# Patient Record
Sex: Female | Born: 1990 | Hispanic: No | Marital: Married | Smoking: Never smoker
Health system: Southern US, Community
[De-identification: ages and names within clinical notes are randomized; demographics above are authoritative.]

## PROBLEM LIST (undated history)

## (undated) ENCOUNTER — Inpatient Hospital Stay (HOSPITAL_COMMUNITY): Payer: Self-pay

## (undated) DIAGNOSIS — O139 Gestational [pregnancy-induced] hypertension without significant proteinuria, unspecified trimester: Secondary | ICD-10-CM

## (undated) DIAGNOSIS — D649 Anemia, unspecified: Secondary | ICD-10-CM

## (undated) DIAGNOSIS — Z789 Other specified health status: Secondary | ICD-10-CM

## (undated) HISTORY — PX: NO PAST SURGERIES: SHX2092

## (undated) HISTORY — DX: Gestational (pregnancy-induced) hypertension without significant proteinuria, unspecified trimester: O13.9

---

## 2016-05-15 LAB — OB RESULTS CONSOLE ANTIBODY SCREEN: Antibody Screen: NEGATIVE

## 2016-05-15 LAB — OB RESULTS CONSOLE GC/CHLAMYDIA
CHLAMYDIA, DNA PROBE: NEGATIVE
Gonorrhea: NEGATIVE

## 2016-05-15 LAB — OB RESULTS CONSOLE HIV ANTIBODY (ROUTINE TESTING): HIV: NONREACTIVE

## 2016-05-15 LAB — OB RESULTS CONSOLE ABO/RH: RH TYPE: POSITIVE

## 2016-05-15 LAB — OB RESULTS CONSOLE HEPATITIS B SURFACE ANTIGEN: HEP B S AG: NEGATIVE

## 2016-05-15 LAB — OB RESULTS CONSOLE RUBELLA ANTIBODY, IGM: Rubella: IMMUNE

## 2016-05-15 LAB — OB RESULTS CONSOLE GBS: STREP GROUP B AG: POSITIVE

## 2016-05-15 LAB — OB RESULTS CONSOLE RPR: RPR: NONREACTIVE

## 2016-06-26 ENCOUNTER — Telehealth (HOSPITAL_COMMUNITY): Payer: Self-pay | Admitting: *Deleted

## 2016-06-26 NOTE — Telephone Encounter (Signed)
Preadmission screen  

## 2016-06-27 ENCOUNTER — Encounter (HOSPITAL_COMMUNITY): Payer: Self-pay | Admitting: *Deleted

## 2016-06-27 ENCOUNTER — Inpatient Hospital Stay (HOSPITAL_COMMUNITY): Payer: Medicaid Other | Admitting: Anesthesiology

## 2016-06-27 ENCOUNTER — Inpatient Hospital Stay (HOSPITAL_COMMUNITY)
Admission: AD | Admit: 2016-06-27 | Discharge: 2016-06-29 | DRG: 775 | Disposition: A | Discharge status: Home / Self Care | Payer: Medicaid Other | Source: Ambulatory Visit | Attending: Obstetrics and Gynecology | Admitting: Obstetrics and Gynecology

## 2016-06-27 DIAGNOSIS — Z3493 Encounter for supervision of normal pregnancy, unspecified, third trimester: Secondary | ICD-10-CM | POA: Diagnosis present

## 2016-06-27 DIAGNOSIS — Z3A39 39 weeks gestation of pregnancy: Secondary | ICD-10-CM | POA: Diagnosis not present

## 2016-06-27 DIAGNOSIS — O9902 Anemia complicating childbirth: Secondary | ICD-10-CM | POA: Diagnosis present

## 2016-06-27 DIAGNOSIS — D649 Anemia, unspecified: Secondary | ICD-10-CM | POA: Diagnosis present

## 2016-06-27 DIAGNOSIS — O99824 Streptococcus B carrier state complicating childbirth: Secondary | ICD-10-CM | POA: Diagnosis present

## 2016-06-27 HISTORY — DX: Other specified health status: Z78.9

## 2016-06-27 HISTORY — DX: Anemia, unspecified: D64.9

## 2016-06-27 LAB — CBC
HCT: 31.3 % — ABNORMAL LOW (ref 36.0–46.0)
Hemoglobin: 10.1 g/dL — ABNORMAL LOW (ref 12.0–15.0)
MCH: 25.1 pg — ABNORMAL LOW (ref 26.0–34.0)
MCHC: 32.3 g/dL (ref 30.0–36.0)
MCV: 77.7 fL — ABNORMAL LOW (ref 78.0–100.0)
Platelets: 261 K/uL (ref 150–400)
RBC: 4.03 MIL/uL (ref 3.87–5.11)
RDW: 16.3 % — ABNORMAL HIGH (ref 11.5–15.5)
WBC: 8 K/uL (ref 4.0–10.5)

## 2016-06-27 LAB — TYPE AND SCREEN
ABO/RH(D): A POS
ANTIBODY SCREEN: NEGATIVE

## 2016-06-27 LAB — ABO/RH: ABO/RH(D): A POS

## 2016-06-27 LAB — RPR: RPR: NONREACTIVE

## 2016-06-27 MED ORDER — ONDANSETRON HCL 4 MG PO TABS: 4.0000 mg | ORAL_TABLET | ORAL | Status: DC | PRN

## 2016-06-27 MED ORDER — PENICILLIN G POTASSIUM 5000000 UNITS IJ SOLR
5.0000 10*6.[IU] | Freq: Once | INTRAVENOUS | Status: AC
  Administered 2016-06-27: 5 10*6.[IU] via INTRAVENOUS
  Filled 2016-06-27: qty 5

## 2016-06-27 MED ORDER — FENTANYL 2.5 MCG/ML BUPIVACAINE 1/10 % EPIDURAL INFUSION (WH - ANES)
14.0000 mL/h | INTRAMUSCULAR | Status: DC | PRN
  Administered 2016-06-27: 14 mL/h via EPIDURAL
  Filled 2016-06-27: qty 100

## 2016-06-27 MED ORDER — DIBUCAINE 1 % RE OINT: 1.0000 "application " | TOPICAL_OINTMENT | RECTAL | Status: DC | PRN

## 2016-06-27 MED ORDER — FLEET ENEMA 7-19 GM/118ML RE ENEM: 1.0000 | ENEMA | Freq: Every day | RECTAL | Status: DC | PRN

## 2016-06-27 MED ORDER — OXYTOCIN BOLUS FROM INFUSION
500.0000 mL | Freq: Once | INTRAVENOUS | Status: AC
  Administered 2016-06-27: 500 mL via INTRAVENOUS

## 2016-06-27 MED ORDER — LIDOCAINE HCL (PF) 1 % IJ SOLN
30.0000 mL | INTRAMUSCULAR | Status: DC | PRN
  Filled 2016-06-27: qty 30

## 2016-06-27 MED ORDER — EPHEDRINE 5 MG/ML INJ
10.0000 mg | INTRAVENOUS | Status: DC | PRN
  Filled 2016-06-27: qty 2

## 2016-06-27 MED ORDER — IBUPROFEN 600 MG PO TABS
600.0000 mg | ORAL_TABLET | Freq: Four times a day (QID) | ORAL | Status: DC
  Administered 2016-06-27 – 2016-06-29 (×8): 600 mg via ORAL
  Filled 2016-06-27 (×8): qty 1

## 2016-06-27 MED ORDER — LACTATED RINGERS IV SOLN
500.0000 mL | Freq: Once | INTRAVENOUS | Status: AC
  Administered 2016-06-27: 500 mL via INTRAVENOUS

## 2016-06-27 MED ORDER — FENTANYL CITRATE (PF) 100 MCG/2ML IJ SOLN: 100.0000 ug | INTRAMUSCULAR | Status: DC | PRN

## 2016-06-27 MED ORDER — OXYCODONE-ACETAMINOPHEN 5-325 MG PO TABS: 1.0000 | ORAL_TABLET | ORAL | Status: DC | PRN

## 2016-06-27 MED ORDER — SODIUM CHLORIDE 0.9 % IV SOLN: 250.0000 mL | INTRAVENOUS | Status: DC | PRN

## 2016-06-27 MED ORDER — COCONUT OIL OIL
1.0000 "application " | TOPICAL_OIL | Status: DC | PRN
  Administered 2016-06-29: 1 via TOPICAL
  Filled 2016-06-27: qty 120

## 2016-06-27 MED ORDER — LACTATED RINGERS IV SOLN
INTRAVENOUS | Status: DC
  Administered 2016-06-27 (×2): via INTRAVENOUS

## 2016-06-27 MED ORDER — SIMETHICONE 80 MG PO CHEW: 80.0000 mg | CHEWABLE_TABLET | ORAL | Status: DC | PRN

## 2016-06-27 MED ORDER — TETANUS-DIPHTH-ACELL PERTUSSIS 5-2.5-18.5 LF-MCG/0.5 IM SUSP: 0.5000 mL | Freq: Once | INTRAMUSCULAR | Status: DC

## 2016-06-27 MED ORDER — DIPHENHYDRAMINE HCL 25 MG PO CAPS: 25.0000 mg | ORAL_CAPSULE | Freq: Four times a day (QID) | ORAL | Status: DC | PRN

## 2016-06-27 MED ORDER — LIDOCAINE HCL (PF) 1 % IJ SOLN
INTRAMUSCULAR | Status: DC | PRN
  Administered 2016-06-27 (×2): 4 mL via EPIDURAL

## 2016-06-27 MED ORDER — ACETAMINOPHEN 325 MG PO TABS
650.0000 mg | ORAL_TABLET | ORAL | Status: DC | PRN
  Administered 2016-06-29: 650 mg via ORAL
  Filled 2016-06-27: qty 2

## 2016-06-27 MED ORDER — ONDANSETRON HCL 4 MG/2ML IJ SOLN: 4.0000 mg | INTRAMUSCULAR | Status: DC | PRN

## 2016-06-27 MED ORDER — SODIUM CHLORIDE 0.9% FLUSH: 3.0000 mL | INTRAVENOUS | Status: DC | PRN

## 2016-06-27 MED ORDER — DIPHENHYDRAMINE HCL 50 MG/ML IJ SOLN: 12.5000 mg | INTRAMUSCULAR | Status: DC | PRN

## 2016-06-27 MED ORDER — MISOPROSTOL 50MCG HALF TABLET
50.0000 ug | ORAL_TABLET | ORAL | Status: DC
  Administered 2016-06-27: 50 ug via ORAL
  Filled 2016-06-27: qty 1

## 2016-06-27 MED ORDER — BENZOCAINE-MENTHOL 20-0.5 % EX AERO
1.0000 "application " | INHALATION_SPRAY | CUTANEOUS | Status: DC | PRN
  Administered 2016-06-29: 1 via TOPICAL
  Filled 2016-06-27 (×2): qty 56

## 2016-06-27 MED ORDER — OXYTOCIN 40 UNITS IN LACTATED RINGERS INFUSION - SIMPLE MED: 2.5000 [IU]/h | INTRAVENOUS | Status: DC

## 2016-06-27 MED ORDER — TERBUTALINE SULFATE 1 MG/ML IJ SOLN
0.2500 mg | Freq: Once | INTRAMUSCULAR | Status: DC | PRN
  Filled 2016-06-27: qty 1

## 2016-06-27 MED ORDER — SENNOSIDES-DOCUSATE SODIUM 8.6-50 MG PO TABS
2.0000 | ORAL_TABLET | ORAL | Status: DC
  Administered 2016-06-27 – 2016-06-29 (×2): 2 via ORAL
  Filled 2016-06-27 (×2): qty 2

## 2016-06-27 MED ORDER — ACETAMINOPHEN 325 MG PO TABS: 650.0000 mg | ORAL_TABLET | ORAL | Status: DC | PRN

## 2016-06-27 MED ORDER — ONDANSETRON HCL 4 MG/2ML IJ SOLN
4.0000 mg | Freq: Four times a day (QID) | INTRAMUSCULAR | Status: DC | PRN
  Administered 2016-06-27: 4 mg via INTRAVENOUS
  Filled 2016-06-27: qty 2

## 2016-06-27 MED ORDER — SOD CITRATE-CITRIC ACID 500-334 MG/5ML PO SOLN: 30.0000 mL | ORAL | Status: DC | PRN

## 2016-06-27 MED ORDER — OXYCODONE-ACETAMINOPHEN 5-325 MG PO TABS: 2.0000 | ORAL_TABLET | ORAL | Status: DC | PRN

## 2016-06-27 MED ORDER — PHENYLEPHRINE 40 MCG/ML (10ML) SYRINGE FOR IV PUSH (FOR BLOOD PRESSURE SUPPORT)
80.0000 ug | PREFILLED_SYRINGE | INTRAVENOUS | Status: DC | PRN
  Filled 2016-06-27: qty 5

## 2016-06-27 MED ORDER — PRENATAL MULTIVITAMIN CH
1.0000 | ORAL_TABLET | Freq: Every day | ORAL | Status: DC
  Administered 2016-06-28 – 2016-06-29 (×2): 1 via ORAL
  Filled 2016-06-27 (×2): qty 1

## 2016-06-27 MED ORDER — WITCH HAZEL-GLYCERIN EX PADS: 1.0000 "application " | MEDICATED_PAD | CUTANEOUS | Status: DC | PRN

## 2016-06-27 MED ORDER — OXYTOCIN 40 UNITS IN LACTATED RINGERS INFUSION - SIMPLE MED: 2.5000 [IU]/h | INTRAVENOUS | Status: DC | PRN

## 2016-06-27 MED ORDER — LACTATED RINGERS IV SOLN: 500.0000 mL | INTRAVENOUS | Status: DC | PRN

## 2016-06-27 MED ORDER — PHENYLEPHRINE 40 MCG/ML (10ML) SYRINGE FOR IV PUSH (FOR BLOOD PRESSURE SUPPORT)
80.0000 ug | PREFILLED_SYRINGE | INTRAVENOUS | Status: DC | PRN
  Filled 2016-06-27: qty 10
  Filled 2016-06-27: qty 5

## 2016-06-27 MED ORDER — ZOLPIDEM TARTRATE 5 MG PO TABS: 5.0000 mg | ORAL_TABLET | Freq: Every evening | ORAL | Status: DC | PRN

## 2016-06-27 MED ORDER — PENICILLIN G POT IN DEXTROSE 60000 UNIT/ML IV SOLN
3.0000 10*6.[IU] | INTRAVENOUS | Status: DC
  Administered 2016-06-27: 3 10*6.[IU] via INTRAVENOUS
  Filled 2016-06-27 (×8): qty 50

## 2016-06-27 MED ORDER — SODIUM CHLORIDE 0.9% FLUSH: 3.0000 mL | Freq: Two times a day (BID) | INTRAVENOUS | Status: DC

## 2016-06-27 MED ORDER — OXYTOCIN 40 UNITS IN LACTATED RINGERS INFUSION - SIMPLE MED
1.0000 m[IU]/min | INTRAVENOUS | Status: DC
  Administered 2016-06-27: 2 m[IU]/min via INTRAVENOUS
  Filled 2016-06-27: qty 1000

## 2016-06-27 NOTE — Anesthesia Procedure Notes (Signed)
Epidural Patient location during procedure: OB Start time: 06/27/2016 10:10 AM  Staffing Anesthesiologist: Mal AmabileFOSTER, Zedrick Springsteen Performed: anesthesiologist   Preanesthetic Checklist Completed: patient identified, site marked, surgical consent, pre-op evaluation, timeout performed, IV checked, risks and benefits discussed and monitors and equipment checked  Epidural Patient position: sitting Prep: site prepped and draped and DuraPrep Patient monitoring: continuous pulse ox and blood pressure Approach: midline Location: L3-L4 Injection technique: LOR air  Needle:  Needle type: Tuohy  Needle gauge: 17 G Needle length: 9 cm and 9 Needle insertion depth: 4 cm Catheter type: closed end flexible Catheter size: 19 Gauge Catheter at skin depth: 9 cm Test dose: negative and Other  Assessment Events: blood not aspirated, injection not painful, no injection resistance, negative IV test and no paresthesia  Additional Notes Patient identified. Risks and benefits discussed including failed block, incomplete  Pain control, post dural puncture headache, nerve damage, paralysis, blood pressure Changes, nausea, vomiting, reactions to medications-both toxic and allergic and post Partum back pain. All questions were answered. Patient expressed understanding and wished to proceed. Sterile technique was used throughout procedure. Epidural site was Dressed with sterile barrier dressing. No paresthesias, signs of intravascular injection Or signs of intrathecal spread were encountered.  Patient was more comfortable after the epidural was dosed. Please see RN's note for documentation of vital signs and FHR which are stable.

## 2016-06-27 NOTE — Progress Notes (Signed)
Patient is 26 y.o. G1P0 5017w6d admitted for SROM and SOL.  Delivery Note At 11:48 AM a healthy female was delivered via Vaginal, Spontaneous Delivery (Presentation: LOA). Anterior shoulder delivered with ease. No nuchal cord. APGAR: 8, 9. Placenta status: spontaneous, intact. Cord: 3 vessels.   Anesthesia: epidural Episiotomy: none Lacerations: bilateral labial; 2nd degree perineal  Suture Repair: 2.0 vicryl, 3.0 vicryl, 4.0 monocryl Est. Blood Loss (mL): 400  Mom to postpartum.  Baby to Couplet care / Skin to Skin.  Upon arrival patient was complete and pushing. She pushed with good maternal effort to deliver a healthy baby girl. Baby delivered without difficulty, was noted to have good tone and place on maternal abdomen for oral suctioning, drying and stimulation. Delayed cord clamping performed. Placenta delivered intact with 3V cord. Vaginal canal and perineum was inspected and bilateral labial lacerations, and 2nd degree perineal laceration were repaired; hemostatic. Pitocin was started and uterus massaged until bleeding slowed. Counts of sharps, instruments, and lap pads were all correct x2.   Lavella Hammocklessandra Tomasi, MS3 06/27/2016, 12:25 PM  Upon my arrival infant had been delivered and had been placed skin to skin. Intact placenta by gentle traction. Lacerations as noted above were repaired by me . Counts correct x 2. Mother and baby recovering in room together.

## 2016-06-27 NOTE — Anesthesia Pain Management Evaluation Note (Signed)
  CRNA Pain Management Visit Note  Patient: April RiddleDalila Fox, 26 y.o., female  "Hello I am a member of the anesthesia team at [facility]. We have an anesthesia team available at all times to provide care throughout the hospital, including epidural management and anesthesia for C-section. I don't know your plan for the delivery whether it a natural birth, water birth, IV sedation, nitrous supplementation, doula or epidural, but we want to meet your pain goals."   1.Was your pain managed to your expectations on prior hospitalizations?   No prior hospitalizations  2.What is your expectation for pain management during this hospitalization?     Epidural  3.How can we help you reach that goal? Support prn  Record the patient's initial score and the patient's pain goal.   Pain: 10  Pain Goal: 3 [facility] wants you to be able to say your pain was always managed very well.  [facility] Center[facility]INKLE,Bufford Helms 06/27/2016

## 2016-06-27 NOTE — Anesthesia Postprocedure Evaluation (Signed)
Anesthesia Post Note  Patient: April Fox  Procedure(s) Performed: * No procedures listed *     Patient location during evaluation: Mother Baby Anesthesia Type: Epidural Level of consciousness: awake Pain management: satisfactory to patient Vital Signs Assessment: post-procedure vital signs reviewed and stable Respiratory status: spontaneous breathing Cardiovascular status: stable Anesthetic complications: no    Last Vitals:  Vitals:   06/27/16 1405 06/27/16 1553  BP: 120/70 117/75  Pulse: 81 77  Resp: 18 20  Temp: 37.4 C 37.3 C    Last Pain:  Vitals:   06/27/16 1553  TempSrc: Oral  PainSc:    Pain Goal: Patients Stated Pain Goal: 8 (06/27/16 0512)               Cephus ShellingBURGER,[name]

## 2016-06-27 NOTE — MAU Note (Signed)
Pt reports SROM @ 0200

## 2016-06-27 NOTE — H&P (Signed)
LABOR AND DELIVERY ADMISSION HISTORY AND PHYSICAL NOTE  April Fox is a 26 y.o. female G1P0 with IUP at [redacted]w[redacted]d by LMP presenting for SROM and SOL.  She notes feeling some contractions earlier this evening and then noticed SROM with light mec around 0200.   She reports +FM, + contractions, No LOF, no VB, no blurry vision, headaches or peripheral edema, and RUQ pain.  She plans on breast feeding. Of note the [place] and [facility] [redacted] weeks gestation.  Dating: By LMP --->  Estimated Date of Delivery: 06/28/16  Prenatal History/Complications:  Past Medical History: Past Medical History:  Diagnosis Date  . Medical history non-contributory     Past Surgical History: Past Surgical History:  Procedure Laterality Date  . NO PAST SURGERIES      Obstetrical History: OB History    Gravida Para Term Preterm AB Living   1             SAB TAB Ectopic Multiple Live Births                  Social History: Social History   Social History  . Marital status: Married    Spouse name: N/A  . Number of children: N/A  . Years of education: N/A   Social History Main Topics  . Smoking status: Never Smoker  . Smokeless tobacco: Never Used  . Alcohol use No  . Drug use: No  . Sexual activity: Not Asked   Other Topics Concern  . None   Social History Narrative  . None    Family History: No family history on file.  Allergies: No Known Allergies  Prescriptions Prior to Admission  Medication Sig Dispense Refill Last Dose  . Prenatal Vit-Fe Fumarate-FA (MULTIVITAMIN-PRENATAL) 27-0.8 MG TABS tablet Take 1 tablet by mouth daily at 12 noon.   06/26/2016 at Unknown time     Review of Systems   All systems reviewed and negative except as stated in HPI  Physical Exam Blood pressure 132/89, pulse 73, temperature 99.1 F (37.3 C), resp. rate 16. General appearance: alert, cooperative and appears stated age, no  distress, sits comfortably in bed Lungs: clear to auscultation bilaterally Heart: regular rate and rhythm Abdomen: soft, non-tender; bowel sounds normal Extremities: No calf swelling or tenderness Fetal monitoring: 145 bmp, mod variability, accels, no decels Uterine activity: contractions irregularly every 3-55m Dilation: 1.5 Effacement (%): 80 Station: -2 Exam by:: B Mosca RN   Prenatal labs: ABO, Rh: A/Positive/-- (04/19 0000) Antibody: Negative (04/19 0000) Rubella: Immune RPR: Nonreactive (04/19 0000)  HBsAg: Negative (04/19 0000)  HIV: Non-reactive (04/19 0000)  GBS: Positive (04/19 0000)  1 hr Glucola: not available Genetic screening:  Not available Anatomy US: not available  Prenatal Transfer Tool  Maternal Diabetes: No Genetic Screening: Declined Maternal Ultrasounds/Referrals: Normal Fetal Ultrasounds or other Referrals:  None Maternal Substance Abuse:  No Significant Maternal Medications:  None Significant Maternal Lab Results: Lab values include: Group B Strep positive  No results found for this or any previous visit (from the past 24 hour(s)).  There are no active problems to display for this patient.   Assessment: April Fox is a 26 y.o. G1P0 at [redacted]w[redacted]d here for spontaneous onset of labor and SROM at 0200 with light meconium.  #Labor: start with cytotec, first dose ordered @0530  - initiate PCN for GBS+ status #Pain: IV pain medications  #FWB: Category I #ID:  GBS positive #  MOF: breast #MOC: considering depo but undecided #Circ:  N/A female infant  Howard PouchLauren Feng, MD PGY-1 [facility]  06/27/2016, 4:54 AM  I have seen and examined this patient and agree with the management plan.

## 2016-06-27 NOTE — Progress Notes (Signed)
Patient ID: [name], female   DOB: [birth year], 26 y.o.   MRN: [id number]  S: Patient seen & examined for progress of labor. Patient beginning to feel more uncomfortable with contractions.    O:  Vitals:   06/27/16 0340 06/27/16 0512 06/27/16 0800  BP: 132/89 128/90 135/83  Pulse: 73 66 67  Resp: 16    Temp: 99.1 F (37.3 C) 98.4 F (36.9 C) 98.7 F (37.1 C)  TempSrc:  Oral Oral  Weight:  180 lb (81.6 kg)   Height:  5\' 7"  (1.702 m)     Dilation: 4 Effacement (%): 90 Cervical Position: Posterior Station: -1 Presentation: Vertex Exam by:: Amahd Morino   FHT: 135 bpm, mod var, +accels, no decels TOCO: q133min   A/P: Continue course, will switch to pitocin  Continue expectant management Anticipate SVD

## 2016-06-27 NOTE — Anesthesia Preprocedure Evaluation (Signed)
Anesthesia Evaluation  Patient identified by MRN, date of birth, ID band Patient awake    Reviewed: Allergy & Precautions, Patient's Chart, lab work & pertinent test results  Airway Mallampati: III       Dental no notable dental hx. (+) Teeth Intact   Pulmonary neg pulmonary ROS,    Pulmonary exam normal breath sounds clear to auscultation       Cardiovascular hypertension, Pt. on medications Normal cardiovascular exam Rhythm:Regular Rate:Normal     Neuro/Psych negative neurological ROS  negative psych ROS   GI/Hepatic Neg liver ROS, GERD  Medicated and Controlled,  Endo/Other  negative endocrine ROS  Renal/GU negative Renal ROS  negative genitourinary   Musculoskeletal negative musculoskeletal ROS (+)   Abdominal   Peds  Hematology  (+) anemia ,   Anesthesia Other Findings   Reproductive/Obstetrics (+) Pregnancy Pre eclampsia                             Anesthesia Physical Anesthesia Plan  ASA: III  Anesthesia Plan: Epidural   Post-op Pain Management:    Induction:   Airway Management Planned: Natural Airway  Additional Equipment:   Intra-op Plan:   Post-operative Plan:   Informed Consent: I have reviewed the patients History and Physical, chart, labs and discussed the procedure including the risks, benefits and alternatives for the proposed anesthesia with the patient or authorized representative who has indicated his/her understanding and acceptance.     Plan Discussed with: Anesthesiologist  Anesthesia Plan Comments:         Anesthesia Quick Evaluation

## 2016-06-27 NOTE — Lactation Note (Signed)
This note was copied from a baby's chart. Lactation Consultation Note  Patient Name: April Fox ZOXWR'UToday's Date: 06/27/2016 Reason for consult: Initial assessment  Initial visit at 5 hours of age.  Mom reports baby has been feeding for about 10 minutes.  Baby has wide gape with bursts of sucking.  When baby slipped off breast falling asleep, nipple noted to be compressed.  Mom reports minimal pain. LC assisted with cross cradle hold baby latched well, but mom not as comfortable. Lc assisted with football hold on right breast.  Baby latched well with wide gape and strong rhythmic sucking and few swallows audible.  Mom reports improved comfort with positioning.  Pillow support given. [facility] resources given and discussed.  Encouraged to feed with early cues on demand.  Early newborn behavior discussed.  Hand expression demonstrated with colostrum visible. LC attempted spoon feeding with just a drop of thick colostrum, then applied to baby's mouth.   Mom verbalized understanding of feeding cues, frequency hand expressing and to call for assist as needed.  Mom to call for assist as needed.     Maternal Data Has patient been taught Hand Expression?: Yes Does the patient have breastfeeding experience prior to this delivery?: No  Feeding Feeding Type: Breast Fed Length of feed: 10 min  LATCH Score/Interventions Latch: Grasps breast easily, tongue down, lips flanged, rhythmical sucking.  Audible Swallowing: A few with stimulation Intervention(s): Skin to skin;Hand expression;Alternate breast massage  Type of Nipple: Everted at rest and after stimulation  Comfort (Breast/Nipple): Soft / non-tender     Hold (Positioning): Assistance needed to correctly position infant at breast and maintain latch. Intervention(s): Breastfeeding basics reviewed;Support Pillows;Position options;Skin to skin  LATCH Score: 8  Lactation Tools Discussed/Used WIC Program: Yes   Consult Status Consult  Status: Follow-up Date: 06/28/16 Follow-up type: In-patient    Shoptaw, Arvella MerlesJana Lynn 06/27/2016, 5:12 PM

## 2016-06-28 NOTE — Progress Notes (Signed)
Post Partum Day 1 Subjective: no complaints, up ad lib, voiding, tolerating PO and + flatus Patient needs to have lactation guidance, will place consult. [place] been in [place] 4 of his uncles have brought family to [place] for delivery of children as per his report. Support system seems stable Objective: Blood pressure 116/74, pulse 73, temperature 98.2 F (36.8 C), temperature source Oral, resp. rate 18, height 5\' 7"  (1.702 m), weight 180 lb (81.6 kg), unknown if currently breastfeeding.  Physical Exam:  General: alert, cooperative, appears stated age and no distress Lochia: appropriate Uterine Fundus: firm at u-2 Incision:  DVT Evaluation: No evidence of DVT seen on physical exam.   Recent Labs  06/27/16 0441  HGB 10.1*  HCT 31.3*    Assessment/Plan: Breastfeeding, Lactation consult and Contraception Depo   LOS: 1 day   Samin Milke V 06/28/2016, 9:08 AM

## 2016-06-28 NOTE — Clinical Social Work Maternal (Signed)
CLINICAL SOCIAL WORK MATERNAL/CHILD NOTE  Patient Details  Name: April Fox MRN: [id number] Date of Birth: 12/15/1990  Date:  06/28/2016  Clinical Social Worker Initiating Note:  Aaryanna Hyden, MSW, LCSW-A   Date/ Time Initiated:  06/28/16/1032              Child's Name:  April Fox   Legal Guardian:  Other (Comment) (Not established by court system; MOB and FOB (Fatah Fox) parent collectively )   Need for Interpreter:  None   Date of Referral:  06/27/16     Reason for Referral:  Late or No Prenatal Care    Referral Source:  RN   Address:  [address] [place], [place] [address]  Phone number:  [phone]   Household Members: Self, Spouse   Natural Supports (not living in the home): Extended Family   Professional Supports:None   Employment:Unemployed   Type of Work: Unemployed    Education:  Other (comment) (Unknown )   Financial Resources:Medicaid   Other Resources: Other (Comment) (None reported)   Cultural/Religious Considerations Which May Impact Care: None reported.   Strengths: Ability to meet basic needs , Compliance with medical plan , Home prepared for child    Risk Factors/Current Problems: None   Cognitive State: Able to Concentrate , Alert , Insightful , Goal Oriented    Mood/Affect: Calm , Comfortable , Interested , Happy    CSW Assessment:CSW met with MOB at bedside to complete assessment for consult regarding LPNC/ Limited PNC @ 34wks. Upon this writers arrival, MOB was cleaning up room while baby and FOB were asleep. This writer explained role and reasoning for visit. MOB was warm and welcoming. This writer inquired about reasoning for LPNC. MOB and FOB both informed this writer that MOB relocated here recently from [place]. MOB notes she was receiving care there; however, records did not transfer. This writer inquired about there ability to get baby and MOB to and from  dr's apts once d/c from the hospital. Both MOB and FOB note there are no barriers. FOB notes he has been here for 13+years and knows his way around well. This writer offered community resources, both parents denied the need. This writer informed parents of 2 drug screens taken and negative UDS results due to lack of PNC. Both parents verbalize understanding.  At this time, no other needs were addressed or requested thus, there are no barriers to d/c.   CSW Plan/Description: Information/Referral to Community Resources , No Further Intervention Required/No Barriers to Discharge, Other (Comment) (CSW will continue to follow pending UDS results and make a report if (+) results come back for substance )    Makenzie Weisner, MSW, LCSW-A Clinical Social Worker  [facility]  Office: [phone]    CLINICAL SOCIAL WORK MATERNAL/CHILD NOTE  Patient Details  Name: Suzetta Fox MRN: [id number] Date of Birth: Jun 11, 1990  Date:  06/28/2016  Clinical Social Worker Initiating Note:  Ferdinand Lango Charidy Cappelletti, MSW, LCSW-A  Date/ Time Initiated:  06/28/16/1032     Child's Name:  April Fox   Legal Guardian:  Other (Comment) (Not established by court system; MOB and FOB (Fatah Fox) parent collectively )   Need for Interpreter:  None   Date of Referral:  06/27/16     Reason for Referral:  Late or No Prenatal Care    Referral Source:  RN   Address:  [address] [place], [place] [address]  Phone number:  [phone]   Household Members:  Self, Spouse   Natural Supports (not living in the home):  Extended Family   Professional Supports: None   Employment: Unemployed   Type of Work: Unemployed    Education:  Other (comment) (Unknown )   Financial Resources:  Medicaid   Other Resources:  Other (Comment) (None reported)   Cultural/Religious Considerations Which May Impact Care:  None reported.   Strengths:  Ability to meet basic needs , Compliance with medical plan , Home prepared for child    Risk Factors/Current Problems:  None   Cognitive State:  Able to Concentrate , Alert , Insightful , Goal Oriented    Mood/Affect:  Calm , Comfortable , Interested , Happy    CSW Assessment: CSW met with MOB at bedside to complete assessment for consult regarding LPNC/ Limited PNC @ 21HYQ. Upon this writers arrival, MOB was cleaning up room while baby and FOB were asleep. This Probation officer explained role and reasoning for visit. MOB was warm and welcoming. This Probation officer inquired about reasoning for [facility]. MOB and FOB both informed this Probation officer that MOB relocated here recently from [place]. MOB notes she was receiving care there; however, records did not transfer. This Probation officer inquired about there ability to get baby and MOB to and from dr's apts once d/c from the  hospital. Both MOB and FOB note there are no barriers. FOB notes he has been here for 13+years and knows his way around well. This Probation officer offered community resources, both parents denied the need. This Probation officer informed parents of 2 drug screens taken and negative UDS results due to lack of [place]. Both parents verbalize understanding.  At this time, no other needs were addressed or requested thus, there are no barriers to d/c.   CSW Plan/Description:  Information/Referral to [employer] , No Further Intervention Required/No Barriers to Discharge, Other (Comment) (CSW will continue to follow pending UDS results and make a report if (+) results come back for substance )    [place], MSW, [facility]  Office: [phone]

## 2016-06-29 MED ORDER — IBUPROFEN 600 MG PO TABS: 600.0000 mg | ORAL_TABLET | Freq: Four times a day (QID) | ORAL | 0 refills | Status: DC

## 2016-06-29 NOTE — Discharge Summary (Signed)
OB Discharge Summary  Patient Name: April Fox DOB: Nov 21, 1990 MRN: [id number]  Date of admission: 06/27/2016 Delivering MD: Michaele Offer   Date of discharge: 06/29/2016  Admitting diagnosis: 40 WEEKS CTX Intrauterine pregnancy: [redacted]w[redacted]d     Secondary diagnosis:Active Problems:   Normal labor  Additional problems:anemia     Discharge diagnosis: Term Pregnancy Delivered and Anemia                                                                     Post partum procedures:n/a  Augmentation: n/a  Complications: None  Hospital course:  Onset of Labor With Vaginal Delivery     26 y.o. yo G1P1001 at [redacted]w[redacted]d was admitted in Active Labor on 06/27/2016. Patient had an uncomplicated labor course as follows:  Membrane Rupture Time/Date: 2:00 AM ,06/27/2016   Intrapartum Procedures: Episiotomy: None [1]                                         Lacerations:  2nd degree [3];Labial [10];Perineal [11]  Patient had a delivery of a Viable infant. 06/27/2016  Information for the patient's newborn:  Leanna, Hamid Girl Saige [[id number]]       Pateint had an uncomplicated postpartum course.  She is ambulating, tolerating a regular diet, passing flatus, and urinating well. Patient is discharged home in stable condition on 06/29/16.   Physical exam  Vitals:   06/28/16 0612 06/28/16 1802 06/29/16 0001 06/29/16 0558  BP: 116/74 118/81 121/61 (!) 110/59  Pulse: 73 78 83 76  Resp: 18 18 16 15   Temp: 98.2 F (36.8 C) 98.4 F (36.9 C) 98.7 F (37.1 C) 98.3 F (36.8 C)  TempSrc: Oral Oral Oral Oral  SpO2:  100% 100% 100%  Weight:      Height:       General: alert, cooperative and no distress Lochia: appropriate Uterine Fundus: firm Incision: N/A DVT Evaluation: No evidence of DVT seen on physical exam. Labs: Lab Results  Component Value Date   WBC 8.0 06/27/2016   HGB 10.1 (L) 06/27/2016   HCT 31.3 (L) 06/27/2016   MCV 77.7 (L) 06/27/2016   PLT 261 06/27/2016   No flowsheet  data found.  Discharge instruction: per After Visit Summary and "Baby and Me Booklet".  After Visit Meds:  Allergies as of 06/29/2016   No Known Allergies     Medication List    TAKE these medications   ferrous sulfate 325 (65 FE) MG EC tablet Take 325 mg by mouth daily with breakfast.   ibuprofen 600 MG tablet Commonly known as:  ADVIL,MOTRIN Take 1 tablet (600 mg total) by mouth every 6 (six) hours.   prenatal multivitamin Tabs tablet Take 1 tablet by mouth daily at 12 noon.       Diet: routine diet  Activity: Advance as tolerated. Pelvic rest for 6 weeks.   Outpatient follow up:6 weeks Follow up Appt:No future appointments. Follow up visit: No Follow-up on file.  Postpartum contraception: Depo Provera  Newborn Data: Live born female  Birth Weight: 7 lb 1.6 oz (3220 g) APGAR: 8, 9  Baby Feeding: Breast Disposition:home with mother  06/29/2016 Wyvonnia DuskyMarie April, CNM

## 2016-06-29 NOTE — Lactation Note (Signed)
This note was copied from a baby's chart. Lactation Consultation Note  Patient Name: Girl Morrell RiddleDalila Guisinger NGEXB'MToday's Date: 06/29/2016 Reason for consult: Follow-up assessment Assisted Mom with positioning and obtaining good depth with latch. Mom has lots of colostrum with hand expression. Baby demonstrates some good suckling bursts, few noted swallows. Baby becomes sleepy at breast after about 10 minutes, reviewed with Mom how to awaken baby to re-latch. Advised Mom baby should be at breast 8-12 times in 24 hours and with feeding ques, try to keep baby nursing for 15-30 minutes both breasts some feedings. Lots of basic teaching reviewed. Mom reports some mild nipple tenderness, no breakdown noted. Advised to apply EBM/coconut oil prn. Engorgement care reviewed if needed. Hand pump given for home use as needed. Flange 24 fits well, reviewed cleaning. Baby has had total of 6.2% weight loss but only 1.8% in past 24 hours. Advised of OP services and support group. Encouraged to call for questions/concerns. [facility]   Maternal Data    Feeding Feeding Type: Breast Fed Length of feed: 20 min  LATCH Score/Interventions Latch: Repeated attempts needed to sustain latch, nipple held in mouth throughout feeding, stimulation needed to elicit sucking reflex. Intervention(s): Assist with latch;Adjust position  Audible Swallowing: Spontaneous and intermittent  Type of Nipple: Everted at rest and after stimulation  Comfort (Breast/Nipple): Soft / non-tender  Problem noted: Mild/Moderate discomfort Interventions (Mild/moderate discomfort):  (Apply EBM/coconut oil to tender nipples)  Hold (Positioning): No assistance needed to correctly position infant at breast. Intervention(s): Position options;Support Pillows;Breastfeeding basics reviewed  LATCH Score: 9  Lactation Tools Discussed/Used Tools: Pump Breast pump type: Double-Electric Breast Pump   Consult Status Consult Status:  Complete Date: 06/29/16 Follow-up type: In-patient    Alfred LevinsGranger, Srihitha Tagliaferri Ann 06/29/2016, 2:36 PM

## 2016-07-03 ENCOUNTER — Inpatient Hospital Stay (HOSPITAL_COMMUNITY): Admission: RE | Admit: 2016-07-03 | Payer: Medicaid Other | Source: Ambulatory Visit

## 2017-12-08 ENCOUNTER — Encounter (HOSPITAL_COMMUNITY): Payer: Self-pay

## 2017-12-08 ENCOUNTER — Other Ambulatory Visit: Payer: Self-pay

## 2017-12-08 ENCOUNTER — Emergency Department (HOSPITAL_COMMUNITY)
Admission: EM | Admit: 2017-12-08 | Discharge: 2017-12-08 | Disposition: A | Discharge status: Home / Self Care | Payer: Self-pay | Attending: Emergency Medicine | Admitting: Emergency Medicine

## 2017-12-08 DIAGNOSIS — K219 Gastro-esophageal reflux disease without esophagitis: Secondary | ICD-10-CM | POA: Insufficient documentation

## 2017-12-08 DIAGNOSIS — R1013 Epigastric pain: Secondary | ICD-10-CM

## 2017-12-08 LAB — CBC
HEMATOCRIT: 42.7 % (ref 36.0–46.0)
HEMOGLOBIN: 13.3 g/dL (ref 12.0–15.0)
MCH: 28.2 pg (ref 26.0–34.0)
MCHC: 31.1 g/dL (ref 30.0–36.0)
MCV: 90.5 fL (ref 80.0–100.0)
Platelets: 305 10*3/uL (ref 150–400)
RBC: 4.72 MIL/uL (ref 3.87–5.11)
RDW: 12.6 % (ref 11.5–15.5)
WBC: 8.2 10*3/uL (ref 4.0–10.5)
nRBC: 0 % (ref 0.0–0.2)

## 2017-12-08 LAB — COMPREHENSIVE METABOLIC PANEL
ALBUMIN: 3.8 g/dL (ref 3.5–5.0)
ALT: 14 U/L (ref 0–44)
AST: 20 U/L (ref 15–41)
Alkaline Phosphatase: 74 U/L (ref 38–126)
Anion gap: 9 (ref 5–15)
BILIRUBIN TOTAL: 0.4 mg/dL (ref 0.3–1.2)
BUN: 12 mg/dL (ref 6–20)
CHLORIDE: 105 mmol/L (ref 98–111)
CO2: 24 mmol/L (ref 22–32)
CREATININE: 0.64 mg/dL (ref 0.44–1.00)
Calcium: 8.8 mg/dL — ABNORMAL LOW (ref 8.9–10.3)
GFR calc Af Amer: >60 mL/min (ref >60)
GLUCOSE: 110 mg/dL — AB (ref 70–99)
POTASSIUM: 3.6 mmol/L (ref 3.5–5.1)
Sodium: 138 mmol/L (ref 135–145)
Total Protein: 6.8 g/dL (ref 6.5–8.1)

## 2017-12-08 LAB — LIPASE, BLOOD: LIPASE: 44 U/L (ref 11–51)

## 2017-12-08 LAB — I-STAT BETA HCG BLOOD, ED (MC, WL, AP ONLY): I-stat hCG, quantitative: <5 m[IU]/mL (ref <5)

## 2017-12-08 MED ORDER — FAMOTIDINE 20 MG PO TABS
20.0000 mg | ORAL_TABLET | Freq: Once | ORAL | Status: AC
  Administered 2017-12-08: 20 mg via ORAL
  Filled 2017-12-08: qty 1

## 2017-12-08 MED ORDER — OMEPRAZOLE 20 MG PO CPDR: 20.0000 mg | DELAYED_RELEASE_CAPSULE | Freq: Every day | ORAL | 0 refills | Status: DC

## 2017-12-08 MED ORDER — ALUM & MAG HYDROXIDE-SIMETH 200-200-20 MG/5ML PO SUSP
30.0000 mL | Freq: Once | ORAL | Status: AC
  Administered 2017-12-08: 30 mL via ORAL
  Filled 2017-12-08: qty 30

## 2017-12-08 NOTE — ED Triage Notes (Signed)
Pt here for evaluation of abdominal pain after eating shrimp.  Hx of abdominal pain after eating shrimp before but it is worse today.  A&Ox4.

## 2017-12-08 NOTE — ED Notes (Signed)
Pt given water for PO challenge, tolerated well. EDP is aware

## 2017-12-08 NOTE — ED Provider Notes (Signed)
[facility] EMERGENCY DEPARTMENT Provider Note   CSN: [id number] Arrival date & time: 12/08/17  0039     History   Chief Complaint Chief Complaint  Patient presents with  . Flank Pain  . Nausea  . Emesis    HPI April Fox is a 27 y.o. female.  HPI  This is a 27 year old female who presents with nausea and abdominal pain.  Patient reports that she had onset of symptoms around 8 PM last night.  She ate [place] food which included shrimp for dinner.  Onset of pain afterwards.  She reports crampy epigastric pain that radiates to her back.  No urinary symptoms or fevers.  She reports nausea without vomiting.  Denies any diarrhea or constipation.  She has had one episode of similar symptoms in the past.  Currently she rates her pain at 9 out of 10.  She has not taken anything for her pain.  Past Medical History:  Diagnosis Date  . Anemia   . Medical history non-contributory     Patient Active Problem List   Diagnosis Date Noted  . Normal labor 06/27/2016    Past Surgical History:  Procedure Laterality Date  . NO PAST SURGERIES       OB History    Gravida  1   Para  1   Term  1   Preterm      AB      Living  1     SAB      TAB      Ectopic      Multiple  0   Live Births  1            Home Medications    Prior to Admission medications   Medication Sig Start Date End Date Taking? Authorizing Provider  ferrous sulfate 325 (65 FE) MG EC tablet Take 325 mg by mouth daily with breakfast.     [provider]  ibuprofen (ADVIL,MOTRIN) 600 MG tablet Take 1 tablet (600 mg total) by mouth every 6 (six) hours. 06/29/16   Montez Morita, CNM  omeprazole (PRILOSEC) 20 MG capsule Take 1 capsule (20 mg total) by mouth daily. 12/08/17   Lynae Pederson, Mayer Masker, MD  Prenatal Vit-Fe Fumarate-FA (PRENATAL MULTIVITAMIN) TABS tablet Take 1 tablet by mouth daily at 12 noon.    [provider]    Family History History reviewed. No  pertinent family history.  Social History Social History   Tobacco Use  . Smoking status: Never Smoker  . Smokeless tobacco: Never Used  Substance Use Topics  . Alcohol use: No  . Drug use: No     Allergies   Patient has no known allergies.   Review of Systems Review of Systems  Constitutional: Negative for fever.  Respiratory: Negative for shortness of breath.   Cardiovascular: Negative for chest pain.  Gastrointestinal: Positive for abdominal pain and nausea. Negative for constipation, diarrhea and vomiting.  Genitourinary: Negative for dysuria.  All other systems reviewed and are negative.    Physical Exam Updated Vital Signs BP 109/70 (BP Location: Right Arm)   Pulse 77   Temp 98.4 F (36.9 C) (Oral)   Resp 14   Ht 1.73 m (5' 8.11")   Wt 82 kg   SpO2 100%   BMI 27.40 kg/m   Physical Exam  Constitutional: She is oriented to person, place, and time. She appears well-developed and well-nourished.  HENT:  Head: Normocephalic and atraumatic.  Eyes: Pupils are  equal, round, and reactive to light.  Neck: Neck supple.  Cardiovascular: Normal rate, regular rhythm and normal heart sounds.  Pulmonary/Chest: Effort normal and breath sounds normal. No respiratory distress. She has no wheezes.  Abdominal: Soft. Bowel sounds are normal. There is tenderness.  Epigastric tenderness to palpation, no rebound or guarding  Neurological: She is alert and oriented to person, place, and time.  Skin: Skin is warm and dry.  Psychiatric: She has a normal mood and affect.  Nursing note and vitals reviewed.    ED Treatments / Results  Labs (all labs ordered are listed, but only abnormal results are displayed) Labs Reviewed  COMPREHENSIVE METABOLIC PANEL - Abnormal; Notable for the following components:      Result Value   Glucose, Bld 110 (*)    Calcium 8.8 (*)    All other components within normal limits  LIPASE, BLOOD  CBC  I-STAT BETA HCG BLOOD, ED ([facility], [facility], [facility] ONLY)     EKG None  Radiology No results found.  Procedures Procedures (including critical care time)  Medications Ordered in ED Medications  alum & mag hydroxide-simeth (MAALOX/MYLANTA) 200-200-20 MG/5ML suspension 30 mL (30 mLs Oral Given 12/08/17 0414)  famotidine (PEPCID) tablet 20 mg (20 mg Oral Given 12/08/17 0414)     Initial Impression / Assessment and Plan / ED Course  I have reviewed the triage vital signs and the nursing notes.  Pertinent labs & imaging results that were available during my care of the patient were reviewed by me and considered in my medical decision making (see chart for details).  Clinical Course as of Dec 08 416  Tue Dec 08, 2017  1610 Patient with improvement of symptoms after GI cocktail and Pepcid.  She is able to tolerate fluids.   [CH]    Clinical Course User Index [CH] Undray Allman, Mayer Masker, MD    Patient presents with epigastric pain after eating dinner.  She is overall nontoxic-appearing and vital signs are reassuring.  She has some epigastric tenderness on exam.  No right upper quadrant tenderness.  Lab testing is reassuring including lipase and LFTs.  High suspicion for gastritis versus reflux.  Doubt pancreatitis or cholecystitis at this time.  Improved pain with GI cocktail and Pepcid.  She is able to tolerate fluids.  Will start on omeprazole.  After history, exam, and medical workup I feel the patient has been appropriately medically screened and is safe for discharge home. Pertinent diagnoses were discussed with the patient. Patient was given return precautions.   Final Clinical Impressions(s) / ED Diagnoses   Final diagnoses:  Epigastric pain  Gastroesophageal reflux disease, esophagitis presence not specified    ED Discharge Orders         Ordered    omeprazole (PRILOSEC) 20 MG capsule  Daily     12/08/17 0418           Shon Baton, MD 12/08/17 0422
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
## 2018-02-14 ENCOUNTER — Emergency Department (HOSPITAL_COMMUNITY): Payer: No Typology Code available for payment source

## 2018-02-14 ENCOUNTER — Encounter (HOSPITAL_COMMUNITY): Payer: Self-pay

## 2018-02-14 ENCOUNTER — Other Ambulatory Visit: Payer: Self-pay

## 2018-02-14 ENCOUNTER — Emergency Department (HOSPITAL_COMMUNITY)
Admission: EM | Admit: 2018-02-14 | Discharge: 2018-02-15 | Disposition: A | Discharge status: Home / Self Care | Payer: No Typology Code available for payment source | Attending: Emergency Medicine | Admitting: Emergency Medicine

## 2018-02-14 DIAGNOSIS — Z79899 Other long term (current) drug therapy: Secondary | ICD-10-CM | POA: Diagnosis not present

## 2018-02-14 DIAGNOSIS — R6884 Jaw pain: Secondary | ICD-10-CM | POA: Diagnosis not present

## 2018-02-14 DIAGNOSIS — S0083XA Contusion of other part of head, initial encounter: Secondary | ICD-10-CM

## 2018-02-14 DIAGNOSIS — M549 Dorsalgia, unspecified: Secondary | ICD-10-CM | POA: Insufficient documentation

## 2018-02-14 DIAGNOSIS — R1013 Epigastric pain: Secondary | ICD-10-CM | POA: Diagnosis not present

## 2018-02-14 MED ORDER — ONDANSETRON HCL 4 MG/2ML IJ SOLN
4.0000 mg | Freq: Once | INTRAMUSCULAR | Status: AC
  Administered 2018-02-14: 4 mg via INTRAVENOUS
  Filled 2018-02-14: qty 2
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
## 2018-02-14 NOTE — ED Notes (Signed)
Pt c/o pain in left jaw, head pain, and epigastric pain.

## 2018-02-15 LAB — POC URINE PREG, ED: Preg Test, Ur: NEGATIVE

## 2018-02-15 NOTE — Discharge Instructions (Signed)
Apply ice as needed.  Take ibuprofen or acetaminophen as needed for pain. 

## 2018-02-17 ENCOUNTER — Encounter (HOSPITAL_COMMUNITY): Payer: Self-pay | Admitting: Emergency Medicine

## 2018-02-17 ENCOUNTER — Other Ambulatory Visit: Payer: Self-pay

## 2018-02-17 ENCOUNTER — Emergency Department (HOSPITAL_COMMUNITY): Payer: No Typology Code available for payment source

## 2018-02-17 ENCOUNTER — Emergency Department (HOSPITAL_COMMUNITY)
Admission: EM | Admit: 2018-02-17 | Discharge: 2018-02-17 | Disposition: A | Discharge status: Home / Self Care | Payer: No Typology Code available for payment source | Attending: Emergency Medicine | Admitting: Emergency Medicine

## 2018-02-17 DIAGNOSIS — R519 Headache, unspecified: Secondary | ICD-10-CM

## 2018-02-17 DIAGNOSIS — Y999 Unspecified external cause status: Secondary | ICD-10-CM | POA: Diagnosis not present

## 2018-02-17 DIAGNOSIS — Y9389 Activity, other specified: Secondary | ICD-10-CM | POA: Insufficient documentation

## 2018-02-17 DIAGNOSIS — Y92410 Unspecified street and highway as the place of occurrence of the external cause: Secondary | ICD-10-CM | POA: Diagnosis not present

## 2018-02-17 DIAGNOSIS — R0789 Other chest pain: Secondary | ICD-10-CM | POA: Insufficient documentation

## 2018-02-17 DIAGNOSIS — R51 Headache: Secondary | ICD-10-CM | POA: Insufficient documentation

## 2018-02-17 DIAGNOSIS — R42 Dizziness and giddiness: Secondary | ICD-10-CM | POA: Diagnosis not present

## 2018-02-17 DIAGNOSIS — M542 Cervicalgia: Secondary | ICD-10-CM | POA: Insufficient documentation

## 2018-02-17 DIAGNOSIS — R5383 Other fatigue: Secondary | ICD-10-CM | POA: Diagnosis not present

## 2018-02-17 DIAGNOSIS — M545 Low back pain, unspecified: Secondary | ICD-10-CM

## 2018-02-17 DIAGNOSIS — Z79899 Other long term (current) drug therapy: Secondary | ICD-10-CM | POA: Insufficient documentation

## 2018-02-17 MED ORDER — NAPROXEN 500 MG PO TABS: 500.0000 mg | ORAL_TABLET | Freq: Two times a day (BID) | ORAL | 0 refills | Status: DC
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
## 2018-02-17 NOTE — Discharge Instructions (Signed)
You can continue Tylenol for pain Start Naproxen for pain. Do not take Ibuprofen with this medicine Use a heating pad on sore areas Return if worsening

## 2018-03-05 DIAGNOSIS — D649 Anemia, unspecified: Secondary | ICD-10-CM | POA: Diagnosis present

## 2018-06-02 LAB — OB RESULTS CONSOLE GC/CHLAMYDIA
Chlamydia: NEGATIVE
Gonorrhea: NEGATIVE

## 2018-06-02 LAB — OB RESULTS CONSOLE HEPATITIS B SURFACE ANTIGEN: Hepatitis B Surface Ag: NEGATIVE

## 2018-06-02 LAB — OB RESULTS CONSOLE RUBELLA ANTIBODY, IGM: Rubella: IMMUNE

## 2018-06-02 LAB — OB RESULTS CONSOLE HIV ANTIBODY (ROUTINE TESTING): HIV: NONREACTIVE

## 2018-06-02 LAB — OB RESULTS CONSOLE VARICELLA ZOSTER ANTIBODY, IGG: Varicella: IMMUNE

## 2018-07-14 ENCOUNTER — Other Ambulatory Visit (HOSPITAL_COMMUNITY): Payer: Self-pay | Admitting: Obstetrics and Gynecology

## 2018-07-14 DIAGNOSIS — Z363 Encounter for antenatal screening for malformations: Secondary | ICD-10-CM

## 2018-07-21 ENCOUNTER — Encounter (HOSPITAL_COMMUNITY): Payer: Self-pay | Admitting: *Deleted

## 2018-07-27 ENCOUNTER — Ambulatory Visit (HOSPITAL_COMMUNITY)
Admission: RE | Admit: 2018-07-27 | Discharge: 2018-07-27 | Disposition: A | Discharge status: Home / Self Care | Payer: Medicaid Other | Source: Ambulatory Visit | Attending: Obstetrics and Gynecology | Admitting: Obstetrics and Gynecology

## 2018-07-27 ENCOUNTER — Encounter (HOSPITAL_COMMUNITY): Payer: Self-pay

## 2018-07-27 ENCOUNTER — Other Ambulatory Visit (HOSPITAL_COMMUNITY): Payer: Self-pay | Admitting: *Deleted

## 2018-07-27 ENCOUNTER — Other Ambulatory Visit: Payer: Self-pay

## 2018-07-27 ENCOUNTER — Ambulatory Visit (HOSPITAL_COMMUNITY): Payer: Medicaid Other | Admitting: *Deleted

## 2018-07-27 ENCOUNTER — Other Ambulatory Visit (HOSPITAL_COMMUNITY): Payer: Self-pay | Admitting: Obstetrics and Gynecology

## 2018-07-27 DIAGNOSIS — Z3A21 21 weeks gestation of pregnancy: Secondary | ICD-10-CM

## 2018-07-27 DIAGNOSIS — Z363 Encounter for antenatal screening for malformations: Secondary | ICD-10-CM | POA: Insufficient documentation

## 2018-07-27 DIAGNOSIS — Z362 Encounter for other antenatal screening follow-up: Secondary | ICD-10-CM

## 2018-08-03 ENCOUNTER — Encounter (HOSPITAL_COMMUNITY): Payer: Self-pay

## 2018-08-24 ENCOUNTER — Other Ambulatory Visit: Payer: Self-pay

## 2018-08-24 ENCOUNTER — Ambulatory Visit (HOSPITAL_COMMUNITY)
Admission: RE | Admit: 2018-08-24 | Discharge: 2018-08-24 | Disposition: A | Discharge status: Home / Self Care | Payer: Medicaid Other | Source: Ambulatory Visit | Attending: Obstetrics and Gynecology | Admitting: Obstetrics and Gynecology

## 2018-08-24 DIAGNOSIS — Z3A25 25 weeks gestation of pregnancy: Secondary | ICD-10-CM

## 2018-08-24 DIAGNOSIS — Z362 Encounter for other antenatal screening follow-up: Secondary | ICD-10-CM | POA: Diagnosis not present

## 2018-09-10 LAB — OB RESULTS CONSOLE RPR: RPR: NONREACTIVE

## 2018-11-10 LAB — OB RESULTS CONSOLE GBS: GBS: POSITIVE

## 2018-12-02 ENCOUNTER — Inpatient Hospital Stay (HOSPITAL_COMMUNITY): Payer: Medicaid Other | Admitting: Anesthesiology

## 2018-12-02 ENCOUNTER — Inpatient Hospital Stay (HOSPITAL_COMMUNITY)
Admission: AD | Admit: 2018-12-02 | Discharge: 2018-12-04 | DRG: 807 | Disposition: A | Discharge status: Home / Self Care | Payer: Medicaid Other | Attending: Obstetrics & Gynecology | Admitting: Obstetrics & Gynecology

## 2018-12-02 ENCOUNTER — Encounter (HOSPITAL_COMMUNITY): Payer: Self-pay

## 2018-12-02 ENCOUNTER — Other Ambulatory Visit: Payer: Self-pay

## 2018-12-02 DIAGNOSIS — Z30017 Encounter for initial prescription of implantable subdermal contraceptive: Secondary | ICD-10-CM | POA: Diagnosis not present

## 2018-12-02 DIAGNOSIS — Z20828 Contact with and (suspected) exposure to other viral communicable diseases: Secondary | ICD-10-CM | POA: Diagnosis present

## 2018-12-02 DIAGNOSIS — B951 Streptococcus, group B, as the cause of diseases classified elsewhere: Secondary | ICD-10-CM | POA: Diagnosis present

## 2018-12-02 DIAGNOSIS — O480 Post-term pregnancy: Principal | ICD-10-CM | POA: Diagnosis present

## 2018-12-02 DIAGNOSIS — D649 Anemia, unspecified: Secondary | ICD-10-CM | POA: Diagnosis present

## 2018-12-02 DIAGNOSIS — O134 Gestational [pregnancy-induced] hypertension without significant proteinuria, complicating childbirth: Secondary | ICD-10-CM | POA: Diagnosis present

## 2018-12-02 DIAGNOSIS — Z3A41 41 weeks gestation of pregnancy: Secondary | ICD-10-CM | POA: Diagnosis not present

## 2018-12-02 DIAGNOSIS — O99824 Streptococcus B carrier state complicating childbirth: Secondary | ICD-10-CM | POA: Diagnosis present

## 2018-12-02 DIAGNOSIS — O9902 Anemia complicating childbirth: Secondary | ICD-10-CM | POA: Diagnosis present

## 2018-12-02 HISTORY — DX: Streptococcus, group b, as the cause of diseases classified elsewhere: B95.1

## 2018-12-02 LAB — COMPREHENSIVE METABOLIC PANEL
ALT: 16 U/L (ref 0–44)
AST: 25 U/L (ref 15–41)
Albumin: 2.7 g/dL — ABNORMAL LOW (ref 3.5–5.0)
Alkaline Phosphatase: 103 U/L (ref 38–126)
Anion gap: 11 (ref 5–15)
BUN: 12 mg/dL (ref 6–20)
CO2: 16 mmol/L — ABNORMAL LOW (ref 22–32)
Calcium: 8.7 mg/dL — ABNORMAL LOW (ref 8.9–10.3)
Chloride: 108 mmol/L (ref 98–111)
Creatinine, Ser: 0.67 mg/dL (ref 0.44–1.00)
GFR calc Af Amer: >60 mL/min (ref >60)
GFR calc non Af Amer: >60 mL/min (ref >60)
Glucose, Bld: 92 mg/dL (ref 70–99)
Potassium: 4.5 mmol/L (ref 3.5–5.1)
Sodium: 135 mmol/L (ref 135–145)
Total Bilirubin: 0.3 mg/dL (ref 0.3–1.2)
Total Protein: 5.9 g/dL — ABNORMAL LOW (ref 6.5–8.1)

## 2018-12-02 LAB — CBC
HCT: 32.9 % — ABNORMAL LOW (ref 36.0–46.0)
Hemoglobin: 10.3 g/dL — ABNORMAL LOW (ref 12.0–15.0)
MCH: 24.7 pg — ABNORMAL LOW (ref 26.0–34.0)
MCHC: 31.3 g/dL (ref 30.0–36.0)
MCV: 78.9 fL — ABNORMAL LOW (ref 80.0–100.0)
Platelets: 353 10*3/uL (ref 150–400)
RBC: 4.17 MIL/uL (ref 3.87–5.11)
RDW: 15.6 % — ABNORMAL HIGH (ref 11.5–15.5)
WBC: 9.9 10*3/uL (ref 4.0–10.5)
nRBC: 0 % (ref 0.0–0.2)

## 2018-12-02 LAB — RPR: RPR Ser Ql: NONREACTIVE

## 2018-12-02 LAB — TYPE AND SCREEN
ABO/RH(D): A POS
Antibody Screen: NEGATIVE

## 2018-12-02 LAB — ABO/RH: ABO/RH(D): A POS

## 2018-12-02 LAB — SARS CORONAVIRUS 2 (TAT 6-24 HRS): SARS Coronavirus 2: NEGATIVE

## 2018-12-02 MED ORDER — ONDANSETRON HCL 4 MG/2ML IJ SOLN: 4.0000 mg | INTRAMUSCULAR | Status: DC | PRN

## 2018-12-02 MED ORDER — ACETAMINOPHEN 325 MG PO TABS: 650.0000 mg | ORAL_TABLET | ORAL | Status: DC | PRN

## 2018-12-02 MED ORDER — ONDANSETRON HCL 4 MG/2ML IJ SOLN: 4.0000 mg | Freq: Four times a day (QID) | INTRAMUSCULAR | Status: DC | PRN

## 2018-12-02 MED ORDER — TETANUS-DIPHTH-ACELL PERTUSSIS 5-2.5-18.5 LF-MCG/0.5 IM SUSP: 0.5000 mL | Freq: Once | INTRAMUSCULAR | Status: DC

## 2018-12-02 MED ORDER — SODIUM CHLORIDE 0.9 % IV SOLN
5.0000 10*6.[IU] | Freq: Once | INTRAVENOUS | Status: AC
  Administered 2018-12-02: 04:00:00 5 10*6.[IU] via INTRAVENOUS
  Filled 2018-12-02: qty 5

## 2018-12-02 MED ORDER — LACTATED RINGERS IV SOLN: 500.0000 mL | Freq: Once | INTRAVENOUS | Status: DC

## 2018-12-02 MED ORDER — ONDANSETRON HCL 4 MG PO TABS: 4.0000 mg | ORAL_TABLET | ORAL | Status: DC | PRN

## 2018-12-02 MED ORDER — PHENYLEPHRINE 40 MCG/ML (10ML) SYRINGE FOR IV PUSH (FOR BLOOD PRESSURE SUPPORT): 80.0000 ug | PREFILLED_SYRINGE | INTRAVENOUS | Status: DC | PRN

## 2018-12-02 MED ORDER — DIBUCAINE (PERIANAL) 1 % EX OINT: 1.0000 "application " | TOPICAL_OINTMENT | CUTANEOUS | Status: DC | PRN

## 2018-12-02 MED ORDER — EPHEDRINE 5 MG/ML INJ: 10.0000 mg | INTRAVENOUS | Status: DC | PRN

## 2018-12-02 MED ORDER — TRANEXAMIC ACID-NACL 1000-0.7 MG/100ML-% IV SOLN
1000.0000 mg | Freq: Once | INTRAVENOUS | Status: AC
  Administered 2018-12-02: 07:00:00 1000 mg via INTRAVENOUS

## 2018-12-02 MED ORDER — ACETAMINOPHEN 325 MG PO TABS
650.0000 mg | ORAL_TABLET | ORAL | Status: DC | PRN
  Administered 2018-12-03: 16:00:00 650 mg via ORAL
  Filled 2018-12-02: qty 2

## 2018-12-02 MED ORDER — SENNOSIDES-DOCUSATE SODIUM 8.6-50 MG PO TABS
2.0000 | ORAL_TABLET | ORAL | Status: DC
  Administered 2018-12-02 – 2018-12-03 (×2): 2 via ORAL
  Filled 2018-12-02 (×2): qty 2

## 2018-12-02 MED ORDER — OXYTOCIN 40 UNITS IN NORMAL SALINE INFUSION - SIMPLE MED
2.5000 [IU]/h | INTRAVENOUS | Status: DC
  Administered 2018-12-02: 07:00:00 2.5 [IU]/h via INTRAVENOUS
  Filled 2018-12-02: qty 1000

## 2018-12-02 MED ORDER — SIMETHICONE 80 MG PO CHEW: 80.0000 mg | CHEWABLE_TABLET | ORAL | Status: DC | PRN

## 2018-12-02 MED ORDER — OXYTOCIN BOLUS FROM INFUSION
500.0000 mL | Freq: Once | INTRAVENOUS | Status: AC
  Administered 2018-12-02: 07:00:00 500 mL via INTRAVENOUS

## 2018-12-02 MED ORDER — COCONUT OIL OIL
1.0000 "application " | TOPICAL_OIL | Status: DC | PRN
  Administered 2018-12-03: 1 via TOPICAL

## 2018-12-02 MED ORDER — IBUPROFEN 600 MG PO TABS
600.0000 mg | ORAL_TABLET | Freq: Four times a day (QID) | ORAL | Status: DC
  Administered 2018-12-02 – 2018-12-04 (×9): 600 mg via ORAL
  Filled 2018-12-02 (×9): qty 1

## 2018-12-02 MED ORDER — DIPHENHYDRAMINE HCL 50 MG/ML IJ SOLN: 12.5000 mg | INTRAMUSCULAR | Status: DC | PRN

## 2018-12-02 MED ORDER — ZOLPIDEM TARTRATE 5 MG PO TABS: 5.0000 mg | ORAL_TABLET | Freq: Every evening | ORAL | Status: DC | PRN

## 2018-12-02 MED ORDER — TRANEXAMIC ACID-NACL 1000-0.7 MG/100ML-% IV SOLN
INTRAVENOUS | Status: AC
  Filled 2018-12-02: qty 100

## 2018-12-02 MED ORDER — PRENATAL MULTIVITAMIN CH
1.0000 | ORAL_TABLET | Freq: Every day | ORAL | Status: DC
  Administered 2018-12-02 – 2018-12-04 (×3): 1 via ORAL
  Filled 2018-12-02 (×3): qty 1

## 2018-12-02 MED ORDER — OXYCODONE-ACETAMINOPHEN 5-325 MG PO TABS: 1.0000 | ORAL_TABLET | ORAL | Status: DC | PRN

## 2018-12-02 MED ORDER — LACTATED RINGERS IV SOLN: 500.0000 mL | INTRAVENOUS | Status: DC | PRN

## 2018-12-02 MED ORDER — SOD CITRATE-CITRIC ACID 500-334 MG/5ML PO SOLN: 30.0000 mL | ORAL | Status: DC | PRN

## 2018-12-02 MED ORDER — FENTANYL-BUPIVACAINE-NACL 0.5-0.125-0.9 MG/250ML-% EP SOLN
12.0000 mL/h | EPIDURAL | Status: DC | PRN
  Filled 2018-12-02: qty 250

## 2018-12-02 MED ORDER — WITCH HAZEL-GLYCERIN EX PADS: 1.0000 "application " | MEDICATED_PAD | CUTANEOUS | Status: DC | PRN

## 2018-12-02 MED ORDER — BENZOCAINE-MENTHOL 20-0.5 % EX AERO
1.0000 "application " | INHALATION_SPRAY | CUTANEOUS | Status: DC | PRN
  Administered 2018-12-02: 1 via TOPICAL
  Filled 2018-12-02: qty 56

## 2018-12-02 MED ORDER — LACTATED RINGERS IV SOLN: INTRAVENOUS | Status: DC

## 2018-12-02 MED ORDER — OXYCODONE-ACETAMINOPHEN 5-325 MG PO TABS: 2.0000 | ORAL_TABLET | ORAL | Status: DC | PRN

## 2018-12-02 MED ORDER — LIDOCAINE HCL (PF) 1 % IJ SOLN: 30.0000 mL | INTRAMUSCULAR | Status: DC | PRN

## 2018-12-02 MED ORDER — PENICILLIN G POT IN DEXTROSE 60000 UNIT/ML IV SOLN
3.0000 10*6.[IU] | INTRAVENOUS | Status: DC
  Filled 2018-12-02: qty 50

## 2018-12-02 MED ORDER — DIPHENHYDRAMINE HCL 25 MG PO CAPS: 25.0000 mg | ORAL_CAPSULE | Freq: Four times a day (QID) | ORAL | Status: DC | PRN

## 2018-12-02 NOTE — MAU Note (Signed)
Pt reports to MAU c/o ctx every 6 min with some bloody show. +FM. No LOF.

## 2018-12-02 NOTE — Discharge Summary (Signed)
Postpartum Discharge Summary     Patient Name: April Fox DOB: [birth year] MRN: [id number]  Date of admission: 12/02/2018 Delivering Provider: Ulla Gallo B   Date of discharge: 12/04/2018  Admitting diagnosis: 54 WKS, CTX, BLEEDING Intrauterine pregnancy: [redacted]w[redacted]d    Secondary diagnosis:  Active Problems:   Normal labor   Anemia   Positive GBS test  Additional problems: none     Discharge diagnosis: Term Pregnancy Delivered                                                                                                Post partum procedures: Nexplanon insertion, clitoral and 1st degree perineal laceration repaor  Augmentation: none  Complications: None  Hospital course:  Onset of Labor With Vaginal Delivery     28y.o. yo [id number] admitted in Active Labor on 12/02/2018. She had time to receive PCN x 1 dose prior to delivery. Patient had an uncomplicated labor course, delivering after spontaneous labor with infant en caul and placenta delivering immediately after infant. Patient received TXA and pitocin given blood loss during delivery.   Membrane Rupture Time/Date: 6:41 AM ,12/02/2018   Intrapartum Procedures: Episiotomy: None [1]                                         Lacerations:  1st degree [2];Perineal [11]  Patient had a delivery of a Viable infant. 12/02/2018  Information for the patient's newborn:  DjMicheline, Markes0[[id number]]Delivery Method: Vag-Spont     Pateint had an uncomplicated postpartum course.  She is ambulating, tolerating a regular diet, passing flatus, and urinating well. Patient is discharged home in stable condition on 12/04/18.  Delivery time: 6:41 AM    Magnesium Sulfate received: No BMZ received: No Rhophylac:N/A MMR:N/A Transfusion:No  Physical exam  Vitals:   12/03/18 0531 12/03/18 1402 12/03/18 2227 12/04/18 0544  BP: 132/87 118/84 121/79 133/82  Pulse: 89 96 81 88  Resp: '18 16 18 18  ' Temp: 98.2 F (36.8 C) 98.1 F  (36.7 C) 98.2 F (36.8 C) 99.2 F (37.3 C)  TempSrc: Oral Oral Oral Oral  SpO2: 100% 100% 100% 100%  Weight:      Height:       General: alert Lochia: appropriate Uterine Fundus: firm Incision: N/A DVT Evaluation: No evidence of DVT seen on physical exam. Labs: Lab Results  Component Value Date   WBC 10.2 12/03/2018   HGB 7.9 (L) 12/03/2018   HCT 25.4 (L) 12/03/2018   MCV 78.9 (L) 12/03/2018   PLT 302 12/03/2018   CMP Latest Ref Rng & Units 12/02/2018  Glucose 70 - 99 mg/dL 92  BUN 6 - 20 mg/dL 12  Creatinine 0.44 - 1.00 mg/dL 0.67  Sodium 135 - 145 mmol/L 135  Potassium 3.5 - 5.1 mmol/L 4.5  Chloride 98 - 111 mmol/L 108  CO2 22 - 32 mmol/L 16(L)  Calcium 8.9 - 10.3 mg/dL 8.7(L)  Total Protein 6.5 - 8.1 g/dL 5.9(L)  Total Bilirubin 0.3 - 1.2 mg/dL 0.3  Alkaline Phos 38 - 126 U/L 103  AST 15 - 41 U/L 25  ALT 0 - 44 U/L 16    Discharge instruction: per After Visit Summary and "Baby and Me Booklet".  After visit meds:  Allergies as of 12/04/2018   No Known Allergies     Medication List    STOP taking these medications   ferrous sulfate 325 (65 FE) MG EC tablet Replaced by: ferrous sulfate 325 (65 FE) MG tablet   naproxen 500 MG tablet Commonly known as: NAPROSYN   omeprazole 20 MG capsule Commonly known as: PRILOSEC     TAKE these medications   acetaminophen 325 MG tablet Commonly known as: Tylenol Take 2 tablets (650 mg total) by mouth every 4 (four) hours as needed (for pain scale < 4).   ferrous sulfate 325 (65 FE) MG tablet Take 1 tablet (325 mg total) by mouth every other day. Start taking on: December 05, 2018 Replaces: ferrous sulfate 325 (65 FE) MG EC tablet   ibuprofen 600 MG tablet Commonly known as: ADVIL Take 1 tablet (600 mg total) by mouth every 6 (six) hours.   prenatal multivitamin Tabs tablet Take 1 tablet by mouth daily at 12 noon.       Diet: routine diet  Activity: Advance as tolerated. Pelvic rest for 6 weeks.    Outpatient follow up:4 weeks post partum visit and 1 week blood pressure check  Follow up Appt:No future appointments. Follow up Visit:   Newborn Data: Live born female  Birth Weight: 7 lb 9.2 oz (3435 g) APGAR: 9, 9  Newborn Delivery   Birth date/time: 12/02/2018 06:41:00 Delivery type: Vaginal, Spontaneous      Baby Feeding: Breast Disposition:home with mother   12/04/2018 Ulla Gallo MD, PGY-1  I confirm that I have verified the information documented in the resident's note and that I have also personally reperformed the history, physical exam and all medical decision making activities of this service and have verified that all service and findings are accurately documented in this student's note.    Elvera Maria, CNM 12/04/2018 11:55 AM

## 2018-12-02 NOTE — Lactation Note (Signed)
This note was copied from a baby's chart. Lactation Consultation Note  Patient Name: April Fox [id number] Date: 12/02/2018 Reason for consult: Initial assessment;Term P2.  Mom breastfed her first baby for 2 years.  Newborn is 3 hours old and has been to breast once.  Mom and baby were sleeping when I came to room.  Instructed to feed with feeding cues and call for assist prn.  Breastfeeding consultation services information given.  Maternal Data Does the patient have breastfeeding experience prior to this delivery?: Yes  Feeding Feeding Type: Breast Fed  LATCH Score Latch: Repeated attempts needed to sustain latch, nipple held in mouth throughout feeding, stimulation needed to elicit sucking reflex.  Audible Swallowing: None  Type of Nipple: Everted at rest and after stimulation  Comfort (Breast/Nipple): Soft / non-tender  Hold (Positioning): Assistance needed to correctly position infant at breast and maintain latch.  LATCH Score: 6  Interventions    Lactation Tools Discussed/Used     Consult Status Consult Status: Follow-up Date: 12/03/18 Follow-up type: In-patient    Ave Filter 12/02/2018, 10:11 AM

## 2018-12-02 NOTE — H&P (Addendum)
April Fox is a 28 y.o. female G2P1001 with IUP at [redacted]w[redacted]d presenting for SOL. Pt states she has been having regular, every 6 minutes contractions, associated with some bloody show.  Membranes are intact, with active fetal movement.   [facility] at [facility] Department since 14 wks  Prenatal History/Complications:  Past Medical History: Past Medical History:  Diagnosis Date  . Anemia     Past Surgical History: Past Surgical History:  Procedure Laterality Date  . NO PAST SURGERIES      Obstetrical History: OB History    Gravida  2   Para  1   Term  1   Preterm      AB      Living  1     SAB      TAB      Ectopic      Multiple  0   Live Births  1            Social History: Social History   Socioeconomic History  . Marital status: Married    Spouse name: Not on file  . Number of children: Not on file  . Years of education: Not on file  . Highest education level: Not on file  Occupational History  . Not on file  Social Needs  . Financial resource strain: Not on file  . Food insecurity    Worry: Not on file    Inability: Not on file  . Transportation needs    Medical: Not on file    Non-medical: Not on file  Tobacco Use  . Smoking status: Never Smoker  . Smokeless tobacco: Never Used  Substance and Sexual Activity  . Alcohol use: No  . Drug use: No  . Sexual activity: Yes  Lifestyle  . Physical activity    Days per week: Not on file    Minutes per session: Not on file  . Stress: Not on file  Relationships  . Social Herbalist on phone: Not on file    Gets together: Not on file    Attends religious service: Not on file    Active member of club or organization: Not on file    Attends meetings of clubs or organizations: Not on file    Relationship status: Not on file  Other Topics Concern  . Not on file  Social History Narrative  . Not on file    Family History: History reviewed. No pertinent family  history.  Allergies: No Known Allergies  Medications Prior to Admission  Medication Sig Dispense Refill Last Dose  . ferrous sulfate 325 (65 FE) MG EC tablet Take 325 mg by mouth daily with breakfast.    12/01/2018 at Unknown time  . Prenatal Vit-Fe Fumarate-FA (PRENATAL MULTIVITAMIN) TABS tablet Take 1 tablet by mouth daily at 12 noon.   12/01/2018 at Unknown time  . ibuprofen (ADVIL,MOTRIN) 600 MG tablet Take 1 tablet (600 mg total) by mouth every 6 (six) hours. (Patient not taking: Reported on 07/27/2018) 30 tablet 0 Unknown at Unknown time  . naproxen (NAPROSYN) 500 MG tablet Take 1 tablet (500 mg total) by mouth 2 (two) times daily with a meal. (Patient not taking: Reported on 07/27/2018) 30 tablet 0 Unknown at Unknown time  . omeprazole (PRILOSEC) 20 MG capsule Take 1 capsule (20 mg total) by mouth daily. (Patient not taking: Reported on 07/27/2018) 30 capsule 0 Unknown at Unknown time   Review of Systems   Constitutional: Negative for  fever and chills Eyes: Negative for visual disturbances Respiratory: Negative for shortness of breath, dyspnea Cardiovascular: Negative for chest pain or palpitations  Gastrointestinal: Negative for vomiting, diarrhea and constipation.  POSITIVE for abdominal pain (contractions) Genitourinary: Negative for dysuria and urgency Musculoskeletal: Negative for back pain, joint pain, myalgias  Neurological: Negative for dizziness and headaches    Blood pressure 135/81, pulse 94, temperature 98.9 F (37.2 C), temperature source Oral, resp. rate 20, height 5\' 8"  (1.727 m), weight 88 kg, last menstrual period 02/18/2018, SpO2 98 %, unknown if currently breastfeeding. General appearance: alert, cooperative and mild distress Lungs: normal respiratory effort Heart: regular rate and rhythm Abdomen: soft, non-tender; bowel sounds normal Extremities: Homans sign is negative, no sign of DVT DTR's 2+ Presentation: cephalic Fetal monitoring  Baseline: 140 bpm,  Variability: Good {> 6 bpm), Accelerations: Reactive and Decelerations: early and some variables Uterine activity  Frequency: Every 1-2 minutes Dilation: 5.5 Effacement (%): 90 Station: -1 Exam by:: [email or web address] RN    Prenatal labs: ABO, Rh: --/--/A POS, A POS Performed at [facility], [address] [place], [place]  [phone]/05 0315) Antibody: NEG (11/05 0315) Rubella:  immune 06/02/18 RPR:   non reactive 09/10/18 HBsAg:   negative 06/02/18 HIV:   non reactive 09/10/18 GBS:   positive 1 hr Glucola 137 at [redacted]w[redacted]d 3hr GTT WNL at 15weeks Genetic screening  declined Anatomy [redacted]w[redacted]d normal  Prenatal Transfer Tool  Maternal Diabetes: No Genetic Screening: Declined Maternal Ultrasounds/Referrals: Normal Fetal Ultrasounds or other Referrals:  None Maternal Substance Abuse:  No Significant Maternal Medications:  None Significant Maternal Lab Results: Group B Strep positive     Results for orders placed or performed during the hospital encounter of 12/02/18 (from the past 24 hour(s))  Type and screen [facility]   Collection Time: 12/02/18  3:15 AM  Result Value Ref Range   ABO/RH(D) A POS    Antibody Screen NEG    Sample Expiration      12/05/2018,2359 Performed at [facility], [address], [place], [place]   ABO/Rh   Collection Time: 12/02/18  3:15 AM  Result Value Ref Range   ABO/RH(D)      A POS Performed at [facility], [address], [place], [place]   CBC   Collection Time: 12/02/18  3:16 AM  Result Value Ref Range   WBC 9.9 4.0 - 10.5 K/uL   RBC 4.17 3.87 - 5.11 MIL/uL   Hemoglobin 10.3 (L) 12.0 - 15.0 g/dL   HCT 13/05/20 (L) 08.6 - 57.8 %   MCV 78.9 (L) 80.0 - 100.0 fL   MCH 24.7 (L) 26.0 - 34.0 pg   MCHC 31.3 30.0 - 36.0 g/dL   RDW 46.9 (H) 62.9 - 52.8 %   Platelets 353 150 - 400 K/uL   nRBC 0.0 0.0 - 0.2 %  Comprehensive metabolic panel   Collection Time: 12/02/18  3:16 AM  Result Value Ref  Range   Sodium 135 135 - 145 mmol/L   Potassium 4.5 3.5 - 5.1 mmol/L   Chloride 108 98 - 111 mmol/L   CO2 16 (L) 22 - 32 mmol/L   Glucose, Bld 92 70 - 99 mg/dL   BUN 12 6 - 20 mg/dL   Creatinine, Ser 13/05/20 0.44 - 1.00 mg/dL   Calcium 8.7 (L) 8.9 - 10.3 mg/dL   Total Protein 5.9 (L) 6.5 - 8.1 g/dL   Albumin 2.7 (L)  3.5 - 5.0 g/dL   AST 25 15 - 41 U/L   ALT 16 0 - 44 U/L   Alkaline Phosphatase 103 38 - 126 U/L   Total Bilirubin 0.3 0.3 - 1.2 mg/dL   GFR calc non Af Amer >60 >60 mL/min   GFR calc Af Amer >60 >60 mL/min   Anion gap 11 5 - 15    Assessment: Morrell RiddleDalila Hoefle is a 28 y.o. G2P1001 with an IUP at 4779w0d presenting for SOL  Plan: #Labor: expectant management, augment as needed with pitocin #Pain:  Epidural at 453 #FWB Cat 1 #ID: GBS: positive #MOF:   #MOC: Nexplanon #Circ: desires in hospital  #elevated BP: differential gestational HTN vs pre-e. Has not had two elevated BP 4hrs apart. Standard Pre-e labs ordered, will monitor  Alexa A Kimker 12/02/2018, 5:21 AM   CNM attestation:  I have seen and examined this patient; I agree with above documentation in the PA student's note.   Morrell RiddleDalila Belloso is a 28 y.o. [id number] here for SOL; labile BPs- will collect labs and follow  PE: BP 118/71 (BP Location: Right Arm)   Pulse 76   Temp 99.4 F (37.4 C) (Oral)   Resp 18   Ht 5\' 8"  (1.727 m)   Wt 88 kg   LMP 02/18/2018   SpO2 100%   Breastfeeding Unknown   BMI 29.50 kg/m   Resp: normal effort, no distress Abd: gravid  ROS, labs, PMH reviewed  Plan: Admit to labor and delivery Plan PCN for GBS ppx Expectant management Pt plans epidural Anticipate vag del  Arabella MerlesKimberly D Lauriel Helin CNM 12/02/2018, 10:38 AM

## 2018-12-02 NOTE — Anesthesia Postprocedure Evaluation (Signed)
Anesthesia Post Note  Patient: April Fox  Procedure(s) Performed: AN AD [place]     Patient location during evaluation: Mother Baby Anesthesia Type: Epidural Level of consciousness: awake Pain management: satisfactory to patient Vital Signs Assessment: post-procedure vital signs reviewed and stable Respiratory status: spontaneous breathing Cardiovascular status: stable Anesthetic complications: no    Last Vitals:  Vitals:   12/02/18 0815 12/02/18 0845  BP:  118/71  Pulse:  76  Resp:  18  Temp: 36.8 C 37.4 C  SpO2:  100%    Last Pain:  Vitals:   12/02/18 1020  TempSrc:   PainSc: 0-No pain   Pain Goal:                   [employer]

## 2018-12-02 NOTE — Anesthesia Preprocedure Evaluation (Signed)
Anesthesia Evaluation  Patient identified by MRN, date of birth, ID band Patient awake    Reviewed: Allergy & Precautions, NPO status , Patient's Chart, lab work & pertinent test results  Airway Mallampati: II  TM Distance: >3 FB Neck ROM: Full    Dental no notable dental hx.    Pulmonary neg pulmonary ROS,    Pulmonary exam normal breath sounds clear to auscultation       Cardiovascular negative cardio ROS Normal cardiovascular exam Rhythm:Regular Rate:Normal     Neuro/Psych negative neurological ROS  negative psych ROS   GI/Hepatic negative GI ROS, Neg liver ROS,   Endo/Other  negative endocrine ROS  Renal/GU negative Renal ROS  negative genitourinary   Musculoskeletal negative musculoskeletal ROS (+)   Abdominal   Peds  Hematology  (+) Blood dyscrasia, anemia ,   Anesthesia Other Findings   Reproductive/Obstetrics (+) Pregnancy                             Anesthesia Physical Anesthesia Plan  ASA: II  Anesthesia Plan: Epidural   Post-op Pain Management:    Induction:   PONV Risk Score and Plan: Treatment may vary due to age or medical condition  Airway Management Planned: Natural Airway  Additional Equipment:   Intra-op Plan:   Post-operative Plan:   Informed Consent: I have reviewed the patients History and Physical, chart, labs and discussed the procedure including the risks, benefits and alternatives for the proposed anesthesia with the patient or authorized representative who has indicated his/her understanding and acceptance.       Plan Discussed with: Anesthesiologist  Anesthesia Plan Comments: (Patient identified. Risks, benefits, options discussed with patient including but not limited to bleeding, infection, nerve damage, paralysis, failed block, incomplete pain control, headache, blood pressure changes, nausea, vomiting, reactions to medication, itching, and  post partum back pain. Confirmed with bedside nurse the patient's most recent platelet count. Confirmed with the patient that they are not taking any anticoagulation, have any bleeding history or any family history of bleeding disorders. Patient expressed understanding and wishes to proceed. All questions were answered. )        Anesthesia Quick Evaluation  

## 2018-12-02 NOTE — Anesthesia Procedure Notes (Signed)
Epidural Patient location during procedure: OB Start time: 12/02/2018 4:35 AM End time: 12/02/2018 4:50 AM  Staffing Anesthesiologist: Freddrick March, MD Performed: anesthesiologist   Preanesthetic Checklist Completed: patient identified, pre-op evaluation, timeout performed, IV checked, risks and benefits discussed and monitors and equipment checked  Epidural Patient position: sitting Prep: site prepped and draped and DuraPrep Patient monitoring: continuous pulse ox, blood pressure, heart rate and cardiac monitor Approach: midline Location: L3-L4 Injection technique: LOR air  Needle:  Needle type: Tuohy  Needle gauge: 17 G Needle length: 9 cm Needle insertion depth: 5 cm Catheter type: closed end flexible Catheter size: 19 Gauge Catheter at skin depth: 11 cm Test dose: negative  Assessment Sensory level: T8 Events: blood not aspirated, injection not painful, no injection resistance, negative IV test and no paresthesia  Additional Notes Patient identified. Risks/Benefits/Options discussed with patient including but not limited to bleeding, infection, nerve damage, paralysis, failed block, incomplete pain control, headache, blood pressure changes, nausea, vomiting, reactions to medication both or allergic, itching and postpartum back pain. Confirmed with bedside nurse the patient's most recent platelet count. Confirmed with patient that they are not currently taking any anticoagulation, have any bleeding history or any family history of bleeding disorders. Patient expressed understanding and wished to proceed. All questions were answered. Sterile technique was used throughout the entire procedure. Please see nursing notes for vital signs. Test dose was given through epidural catheter and negative prior to continuing to dose epidural or start infusion. Warning signs of high block given to the patient including shortness of breath, tingling/numbness in hands, complete motor block,  or any concerning symptoms with instructions to call for help. Patient was given instructions on fall risk and not to get out of bed. All questions and concerns addressed with instructions to call with any issues or inadequate analgesia.  Reason for block:procedure for pain

## 2018-12-03 DIAGNOSIS — Z30017 Encounter for initial prescription of implantable subdermal contraceptive: Secondary | ICD-10-CM

## 2018-12-03 LAB — CBC
HCT: 25.4 % — ABNORMAL LOW (ref 36.0–46.0)
Hemoglobin: 7.9 g/dL — ABNORMAL LOW (ref 12.0–15.0)
MCH: 24.5 pg — ABNORMAL LOW (ref 26.0–34.0)
MCHC: 31.1 g/dL (ref 30.0–36.0)
MCV: 78.9 fL — ABNORMAL LOW (ref 80.0–100.0)
Platelets: 302 10*3/uL (ref 150–400)
RBC: 3.22 MIL/uL — ABNORMAL LOW (ref 3.87–5.11)
RDW: 15.9 % — ABNORMAL HIGH (ref 11.5–15.5)
WBC: 10.2 10*3/uL (ref 4.0–10.5)
nRBC: 0 % (ref 0.0–0.2)

## 2018-12-03 LAB — PROTEIN / CREATININE RATIO, URINE
Creatinine, Urine: 54.7 mg/dL
Protein Creatinine Ratio: 0.13 mg/mg{Cre} (ref 0.00–0.15)
Total Protein, Urine: 7 mg/dL

## 2018-12-03 MED ORDER — LIDOCAINE HCL 1 % IJ SOLN
0.0000 mL | Freq: Once | INTRAMUSCULAR | Status: AC | PRN
  Administered 2018-12-03: 08:00:00 20 mL via INTRADERMAL
  Filled 2018-12-03 (×2): qty 20

## 2018-12-03 MED ORDER — FERROUS SULFATE 325 (65 FE) MG PO TABS
325.0000 mg | ORAL_TABLET | ORAL | Status: DC
  Administered 2018-12-03: 10:00:00 325 mg via ORAL
  Filled 2018-12-03: qty 1

## 2018-12-03 MED ORDER — ETONOGESTREL 68 MG ~~LOC~~ IMPL
68.0000 mg | DRUG_IMPLANT | Freq: Once | SUBCUTANEOUS | Status: AC
  Administered 2018-12-03: 08:00:00 68 mg via SUBCUTANEOUS
  Filled 2018-12-03: qty 1

## 2018-12-03 NOTE — Progress Notes (Signed)
POSTPARTUM PROGRESS NOTE  Post Partum Day 1  Subjective:  April Fox is a 28 y.o. E3M6294 s/p NSVD at [redacted]w[redacted]d.  She reports she is doing well. No acute events overnight. She denies any problems with ambulating, voiding or po intake. Denies nausea or vomiting.  Pain is well controlled.  Lochia is appropriate.    Objective: Blood pressure 132/87, pulse 89, temperature 98.2 F (36.8 C), temperature source Oral, resp. rate 18, height 5\' 8"  (1.727 m), weight 88 kg, last menstrual period 02/18/2018, SpO2 100 %, unknown if currently breastfeeding.  Physical Exam:  General: alert, cooperative and no distress Chest: no respiratory distress Heart:regular rate, distal pulses intact Abdomen: soft, nontender,  Uterine Fundus: firm, appropriately tender DVT Evaluation: No calf swelling or tenderness Extremities: trace LE edema Skin: warm, dry  Recent Labs    12/02/18 0316 12/03/18 0556  HGB 10.3* 7.9*  HCT 32.9* 25.4*    Assessment/Plan: April Fox is a 28 y.o. T6L4650 s/p NSVD at [redacted]w[redacted]d   PPD#1 - Doing well  Routine postpartum care Anemia: start PO iron PIH: several mild range BP's since admission, CBC/CMP without changes, UPCR ordered Contraception: Nexplanon today Feeding: Breast Dispo: Plan for discharge PPD#2.   LOS: 1 day   Augustin Coupe, MD/[facility] OB Fellow  12/03/2018, 7:25 AM

## 2018-12-03 NOTE — Procedures (Signed)

## 2018-12-03 NOTE — Lactation Note (Signed)
This note was copied from a baby's chart. Lactation Consultation Note  Patient Name: April Fox [id number] Date: 12/03/2018 Reason for consult: Term  [id number] F/U visit with P2 mom, delivered @ 41wks, baby is now 44 hours old with 3% wt loss.  LC entered room and mom states baby just fed. FOB at bedside and holding baby that is awake but appears satisfied, no cuing observed.  Mom denies any pain with latch and states baby feeds great. Mom denies any concerns with breastfeeding at this time.  Reviewed hand expression prior to latching and mom verbalized understanding.   Reviewed IP/OP lactation services and phone number for lactation.  Maternal Data Has patient been taught Hand Expression?: Yes   Interventions Interventions: Breast massage;Hand express  Consult Status Consult Status: Follow-up Date: 12/04/18 Follow-up type: In-patient    Cranston Neighbor 12/03/2018, 10:39 AM

## 2018-12-04 MED ORDER — FERROUS SULFATE 325 (65 FE) MG PO TABS: 325.0000 mg | ORAL_TABLET | ORAL | 0 refills | Status: DC

## 2018-12-04 MED ORDER — IBUPROFEN 600 MG PO TABS: 600.0000 mg | ORAL_TABLET | Freq: Four times a day (QID) | ORAL | 0 refills | Status: DC

## 2018-12-04 MED ORDER — ACETAMINOPHEN 325 MG PO TABS: 650.0000 mg | ORAL_TABLET | ORAL | 0 refills | Status: DC | PRN

## 2018-12-04 NOTE — Lactation Note (Signed)
This note was copied from a baby's chart. Lactation Consultation Note Baby 59 hrs old. Mom BF when LC came into rm. Baby has been cluster feeding. Mom is supplementing w/Gerber formula. Mom BF her now 2 1/29 yr old for 2 yrs until she found out she was expecting.  Reviewed engorgement, milk storage, pumping, breast massage, supply and demand. Mom feels good about going home and feels BF going well. Reminded of OP services if needed.  Patient Name: April Fox [id number] Date: 12/04/2018 Reason for consult: Follow-up assessment   Maternal Data    Feeding Feeding Type: Breast Milk with Formula added Nipple Type: Slow - flow  LATCH Score Latch: Grasps breast easily, tongue down, lips flanged, rhythmical sucking.        Comfort (Breast/Nipple): Soft / non-tender  Hold (Positioning): No assistance needed to correctly position infant at breast.     Interventions Interventions: Breast feeding basics reviewed  Lactation Tools Discussed/Used     Consult Status Consult Status: Complete Date: 12/04/18 Follow-up type: In-patient    Teria Khachatryan, Elta Guadeloupe 12/04/2018, 4:09 AM

## 2019-04-21 ENCOUNTER — Emergency Department (HOSPITAL_COMMUNITY)
Admission: EM | Admit: 2019-04-21 | Discharge: 2019-04-21 | Disposition: A | Discharge status: Home / Self Care | Payer: Medicaid Other | Attending: Emergency Medicine | Admitting: Emergency Medicine

## 2019-04-21 ENCOUNTER — Emergency Department (HOSPITAL_COMMUNITY): Payer: Medicaid Other

## 2019-04-21 ENCOUNTER — Other Ambulatory Visit: Payer: Self-pay

## 2019-04-21 ENCOUNTER — Encounter (HOSPITAL_COMMUNITY): Payer: Self-pay | Admitting: Emergency Medicine

## 2019-04-21 DIAGNOSIS — U071 COVID-19: Secondary | ICD-10-CM | POA: Insufficient documentation

## 2019-04-21 DIAGNOSIS — M7918 Myalgia, other site: Secondary | ICD-10-CM | POA: Diagnosis not present

## 2019-04-21 DIAGNOSIS — R0789 Other chest pain: Secondary | ICD-10-CM | POA: Diagnosis not present

## 2019-04-21 DIAGNOSIS — R5383 Other fatigue: Secondary | ICD-10-CM | POA: Diagnosis present

## 2019-04-21 DIAGNOSIS — Z20822 Contact with and (suspected) exposure to covid-19: Secondary | ICD-10-CM

## 2019-04-21 LAB — BASIC METABOLIC PANEL WITH GFR
Anion gap: 10 (ref 5–15)
BUN: 9 mg/dL (ref 6–20)
CO2: 24 mmol/L (ref 22–32)
Calcium: 9.2 mg/dL (ref 8.9–10.3)
Chloride: 105 mmol/L (ref 98–111)
Creatinine, Ser: 0.74 mg/dL (ref 0.44–1.00)
GFR calc Af Amer: >60 mL/min
GFR calc non Af Amer: >60 mL/min
Glucose, Bld: 96 mg/dL (ref 70–99)
Potassium: 4.1 mmol/L (ref 3.5–5.1)
Sodium: 139 mmol/L (ref 135–145)

## 2019-04-21 LAB — CBC WITH DIFFERENTIAL/PLATELET
Abs Immature Granulocytes: 0.02 10*3/uL (ref 0.00–0.07)
Basophils Absolute: 0 10*3/uL (ref 0.0–0.1)
Basophils Relative: 0 %
Eosinophils Absolute: 0 10*3/uL (ref 0.0–0.5)
Eosinophils Relative: 1 %
HCT: 41.7 % (ref 36.0–46.0)
Hemoglobin: 12.6 g/dL (ref 12.0–15.0)
Immature Granulocytes: 0 %
Lymphocytes Relative: 25 %
Lymphs Abs: 1.8 10*3/uL (ref 0.7–4.0)
MCH: 22.6 pg — ABNORMAL LOW (ref 26.0–34.0)
MCHC: 30.2 g/dL (ref 30.0–36.0)
MCV: 74.9 fL — ABNORMAL LOW (ref 80.0–100.0)
Monocytes Absolute: 0.5 10*3/uL (ref 0.1–1.0)
Monocytes Relative: 8 %
Neutro Abs: 4.6 10*3/uL (ref 1.7–7.7)
Neutrophils Relative %: 66 %
Platelets: 347 10*3/uL (ref 150–400)
RBC: 5.57 MIL/uL — ABNORMAL HIGH (ref 3.87–5.11)
RDW: 20.9 % — ABNORMAL HIGH (ref 11.5–15.5)
WBC: 7 10*3/uL (ref 4.0–10.5)
nRBC: 0 % (ref 0.0–0.2)

## 2019-04-21 LAB — TROPONIN I (HIGH SENSITIVITY): Troponin I (High Sensitivity): 2 ng/L (ref <18)

## 2019-04-21 LAB — SARS CORONAVIRUS 2 (TAT 6-24 HRS): SARS Coronavirus 2: POSITIVE — AB

## 2019-04-21 NOTE — Discharge Instructions (Addendum)
Recommend following isolation precautions for the next 14 days.  You can follow the test results in the MyChart app.  Return to the emergency department if your chest pain worsens, you develop shortness of breath, or other severe symptoms.

## 2019-04-21 NOTE — ED Triage Notes (Signed)
Pt reports that her husband tested covid + on Sunday. She lost taste and smell, fatigue, headache, body aches, runny nose.

## 2019-04-21 NOTE — ED Provider Notes (Signed)
[facility] DEPT Provider Note   CSN: [id number] Arrival date & time: 04/21/19  1439     History Chief Complaint  Patient presents with  . Covid Exposure  . Nasal Congestion  . Generalized Body Aches  . Fatigue    April Fox is a 29 y.o. female.  Presents to ER with with concern for COVID-19.  Has been having intermittent body aches, nasal congestion, fatigue for the past couple days.  Husband tested positive on Sunday.  Has not had shortness of breath.  Has been having some intermittent chest pain.  Pain worse with coughing.  Has seemed to be relatively constant.  Not associated with exertion.  Dull, achy, central.  Denies any chronic medical problems, no smoking history, no DVT/PE history, no cardiac history, no family history of early cardiac disease.  Has felt febrile but has not checked temperature at home.    HPI     Past Medical History:  Diagnosis Date  . Anemia     Patient Active Problem List   Diagnosis Date Noted  . Positive GBS test 12/02/2018  . Anemia 03/05/2018  . Normal labor 06/27/2016    Past Surgical History:  Procedure Laterality Date  . NO PAST SURGERIES       OB History    Gravida  2   Para  2   Term  2   Preterm      AB      Living  2     SAB      TAB      Ectopic      Multiple  0   Live Births  2           No family history on file.  Social History   Tobacco Use  . Smoking status: Never Smoker  . Smokeless tobacco: Never Used  Substance Use Topics  . Alcohol use: No  . Drug use: No    Home Medications Prior to Admission medications   Medication Sig Start Date End Date Taking? Authorizing Provider  acetaminophen (TYLENOL) 325 MG tablet Take 2 tablets (650 mg total) by mouth every 4 (four) hours as needed (for pain scale < 4). 12/04/18   Leftwich-Kirby, Kathie Dike, CNM  ferrous sulfate 325 (65 FE) MG tablet Take 1 tablet (325 mg total) by mouth every other day. 12/05/18 01/04/19   Leftwich-Kirby, Kathie Dike, CNM  ibuprofen (ADVIL) 600 MG tablet Take 1 tablet (600 mg total) by mouth every 6 (six) hours. 12/04/18   Leftwich-Kirby, Kathie Dike, CNM  Prenatal Vit-Fe Fumarate-FA (PRENATAL MULTIVITAMIN) TABS tablet Take 1 tablet by mouth daily at 12 noon.    [provider]    Allergies    Patient has no known allergies.  Review of Systems   Review of Systems  Constitutional: Positive for chills, fatigue and fever.  HENT: Negative for ear pain and sore throat.   Eyes: Negative for pain and visual disturbance.  Respiratory: Negative for cough and shortness of breath.   Cardiovascular: Positive for chest pain. Negative for palpitations.  Gastrointestinal: Negative for abdominal pain and vomiting.  Genitourinary: Negative for dysuria and hematuria.  Musculoskeletal: Negative for arthralgias and back pain.  Skin: Negative for color change and rash.  Neurological: Negative for seizures and syncope.  All other systems reviewed and are negative.   Physical Exam Updated Vital Signs BP (!) 135/101   Pulse (!) 105   Temp 98.8 F (37.1 C) (Oral)   Resp 15  SpO2 98%   Physical Exam Vitals and nursing note reviewed.  Constitutional:      General: She is not in acute distress.    Appearance: She is well-developed.  HENT:     Head: Normocephalic and atraumatic.  Eyes:     Conjunctiva/sclera: Conjunctivae normal.  Cardiovascular:     Rate and Rhythm: Normal rate and regular rhythm.     Heart sounds: No murmur.  Pulmonary:     Effort: Pulmonary effort is normal. No respiratory distress.     Breath sounds: Normal breath sounds.  Abdominal:     Palpations: Abdomen is soft.     Tenderness: There is no abdominal tenderness.  Musculoskeletal:        General: No tenderness or deformity.     Cervical back: Neck supple.  Skin:    General: Skin is warm and dry.     Capillary Refill: Capillary refill takes less than 2 seconds.  Neurological:     General: No focal  deficit present.     Mental Status: She is alert and oriented to person, place, and time.     ED Results / Procedures / Treatments   Labs (all labs ordered are listed, but only abnormal results are displayed) Labs Reviewed  SARS CORONAVIRUS 2 (TAT 6-24 HRS)  CBC WITH DIFFERENTIAL/PLATELET  BASIC METABOLIC PANEL  TROPONIN I (HIGH SENSITIVITY)    EKG EKG Interpretation  Date/Time:  Thursday April 21 2019 15:45:52 EDT Ventricular Rate:  111 PR Interval:    QRS Duration: 79 QT Interval:  310 QTC Calculation: 422 R Axis:   77 Text Interpretation: Sinus tachycardia Biatrial enlargement Abnormal T, consider ischemia, diffuse leads Confirmed by Marianna Fuss ([id number]) on 04/21/2019 4:16:43 PM   Radiology No results found.  Procedures Procedures (including critical care time)  Medications Ordered in ED Medications - No data to display  ED Course  I have reviewed the triage vital signs and the nursing notes.  Pertinent labs & imaging results that were available during my care of the patient were reviewed by me and considered in my medical decision making (see chart for details).    MDM Rules/Calculators/A&P                      29 year old female presenting to ER with cough, congestion, loss of taste, Covid exposure.  Symptomatology consistent with COVID-19.  Will send for Covid swab.  She mentions some nonspecific chest pain.  No cardiac risk factors, initially ordered chest EKG.  She had some nonspecific T wave inversions.  No prior for comparison.  Will send basic labs including troponin for further eval.  While awaiting blood work and reassessment, patient signed out to Dr. Jacqulyn Bath.  Anticipate discharge home if labs well within normal limits.  Patient given instructions regarding quarantine procedures and return precautions already.   Final Clinical Impression(s) / ED Diagnoses Final diagnoses:  Suspected COVID-19 virus infection    Rx / DC Orders ED Discharge Orders     None       Milagros Loll, MD 04/21/19 1702

## 2019-04-21 NOTE — ED Provider Notes (Signed)
Blood pressure (!) 135/101, pulse (!) 105, temperature 98.8 F (37.1 C), temperature source Oral, resp. rate 15, SpO2 98 %, unknown if currently breastfeeding.  Assuming care from Dr. Stevie Kern.  In short, April Fox is a 29 y.o. female with a chief complaint of Covid Exposure, Nasal Congestion, Generalized Body Aches, and Fatigue .  Refer to the original H&P for additional details.  The current plan of care is to f/u labs. Suspect COVID clinically but patient low risk with severe disease and not hypoxemic here.   05:51 PM  Patient's troponin is 2.  Lab work reviewed with no acute abnormalities.  Chest x-ray reviewed.  Patient is not hypoxemic.  She does have mild tachycardia.  Her chest discomfort is very atypical for PE.  Seems much more consistent with COVID-19 diagnosis.  Her husband also has Covid.  Plan for discharge home with continued supportive care.  Discussed ED return precautions and quarantine plan for the next 10 to 14 days. Will follow her test results in the MyChart app. Information provided in AVS.   April Fox was evaluated in Emergency Department on 04/21/2019 for the symptoms described in the history of present illness. She was evaluated in the context of the global COVID-19 pandemic, which necessitated consideration that the patient might be at risk for infection with the SARS-CoV-2 virus that causes COVID-19. Institutional protocols and algorithms that pertain to the evaluation of patients at risk for COVID-19 are in a state of rapid change based on information released by regulatory bodies including the CDC and federal and state organizations. These policies and algorithms were followed during the patient's care in the ED.     Maia Plan, MD 04/21/19 [phone]

## 2019-04-22 ENCOUNTER — Telehealth (HOSPITAL_COMMUNITY): Payer: Self-pay

## 2019-05-23 NOTE — Progress Notes (Signed)
Virtual Cardiology Consult  via Telephone Note   This visit type was conducted due to national recommendations for restrictions regarding the COVID-19 Pandemic (e.g. social distancing) in an effort to limit this patient's exposure and mitigate transmission in our community.  Due to her co-morbid illnesses, this patient is at least at moderate risk for complications without adequate follow up.  This format is felt to be most appropriate for this patient at this time.  The patient did not have access to video technology/had technical difficulties with video requiring transitioning to audio format only (telephone).  All issues noted in this document were discussed and addressed.  No physical exam could be performed with this format.  Please refer to the patient's chart for her  consent to telehealth for [facility].   Evaluation Performed:  Cardiology Consult  This visit type was conducted due to national recommendations for restrictions regarding the COVID-19 Pandemic (e.g. social distancing).  This format is felt to be most appropriate for this patient at this time.  All issues noted in this document were discussed and addressed.  No physical exam was performed (except for noted visual exam findings with Video Visits).  Please refer to the patient's chart (MyChart message for video visits and phone note for telephone visits) for the patient's consent to telehealth for [facility].  Date:  05/24/2019   ID:  April Fox, DOB March 02, 1990, MRN [id number]  Patient Location:  Home  Provider location:   [place]  PCP: None Cardiologist:  Armanda Magic, MD  Electrophysiologist:  None   Chief Complaint:  Chest pain  History of Present Illness:    April Fox is a 29 y.o. female who presents via audio/video conferencing for a telehealth visit today for evaluation of chest pain in referral by Dr. Alona Bene.   This is a 28yo female who was recently seen in the ER with complaints of  Chest pain. She apparently presented with complaints of nasal congestion, generalized body aches and fatigue.  She tested positive for COVID 19.  She complained of some mild chest pain and chest CT was negative for PE.  EKG was abnormal with diffuse T wave abnormality. hsTrop was normal x 2.    She is here today for evaluation and is doing well.  She tells me that she started having chest pain around the time of her other COVID 19 symptoms.  She was having pain in her back and shoulders and pressure in her chest with some radiation into her neck and arms.  She felt nauseated and would get diaphoretic with the pain.  The pain was intermittent and no change in intensity with movements. Her EKG in ER showed diffuse T wave inversions in inferior and anterior leads. She denies any  SOB, DOE, PND, orthopnea, LE edema, dizziness, palpitations or syncope. Since getting over COVID 19 her symptoms have completely resolved.  She has not had any further chest, back or shoulder pain. She has never smoked.  She has no family hx of CAD.    The patient does not have symptoms concerning for COVID-19 infection (fever, chills, cough, or new shortness of breath).   Prior CV studies:   The following studies were reviewed today:  ER records and EKG  Past Medical History:  Diagnosis Date  . Anemia   . Hypertension of pregnancy, transient    Past Surgical History:  Procedure Laterality Date  . NO PAST SURGERIES       Current Meds  Medication Sig  .  acetaminophen (TYLENOL) 325 MG tablet Take 2 tablets (650 mg total) by mouth every 4 (four) hours as needed (for pain scale < 4).  . ferrous sulfate 325 (65 FE) MG tablet Take 1 tablet (325 mg total) by mouth every other day.  . ibuprofen (ADVIL) 600 MG tablet Take 1 tablet (600 mg total) by mouth every 6 (six) hours.  . Prenatal Vit-Fe Fumarate-FA (PRENATAL MULTIVITAMIN) TABS tablet Take 1 tablet by mouth daily at 12 noon.     Allergies:   Patient has no known  allergies.   Social History   Tobacco Use  . Smoking status: Never Smoker  . Smokeless tobacco: Never Used  Substance Use Topics  . Alcohol use: No  . Drug use: No     Family Hx: The patient's family history is negative for CAD.  ROS:   Please see the history of present illness.     All other systems reviewed and are negative.   Labs/Other Tests and Data Reviewed:    Recent Labs: 12/02/2018: ALT 16 04/21/2019: BUN 9; Creatinine, Ser 0.74; Hemoglobin 12.6; Platelets 347; Potassium 4.1; Sodium 139   Recent Lipid Panel No results found for: CHOL, TRIG, HDL, CHOLHDL, LDLCALC, LDLDIRECT  Wt Readings from Last 3 Encounters:  05/24/19 180 lb (81.6 kg)  12/02/18 194 lb (88 kg)  02/14/18 158 lb (71.7 kg)     Objective:    Vital Signs:  Ht 5\' 8"  (1.727 m)   Wt 180 lb (81.6 kg)   BMI 27.37 kg/m     ASSESSMENT & PLAN:    1.  Chest pain -atypical and likely related to COVID 19 infection -? Whether she had acute pericarditis given EKG changes that were diffuse -her pain has completely resolved and has not had any further episodes -she has no sx of CP prior to COVID infection -I will get a 2D echo to assess LVF and look at pericardium -repeat EKG when she comes in for echo -if echo is normal and EKG normalized then would not pursue further ischemic workup as she has no cardiac risk factors and is young and unlikely to have CAD  2.  COVID 19 -recent infection that appears resolved. -as above will get 2D echo to make sure LVF is intact  3.  Transient hypertension of pregnancy -this has resolved and she has not had any further problems with BP  COVID-19 Education: The signs and symptoms of COVID-19 were discussed with the patient and how to seek care for testing (follow up with PCP or arrange E-visit).  The importance of social distancing was discussed today.  Patient Risk:   After full review of this patient's clinical status, I feel that they are at least moderate risk  at this time.  Time:   Today, I have spent 30 minutes on telemedicine discussing medical problems including chest pain, HTN and COVID 19 and reviewing patient's chart including ER records, Cxray, ER labs and EKG. .  Medication Adjustments/Labs and Tests Ordered: Current medicines are reviewed at length with the patient today.  Concerns regarding medicines are outlined above.  Tests Ordered: No orders of the defined types were placed in this encounter.  Medication Changes: No orders of the defined types were placed in this encounter.   Disposition:  Follow up prn  Signed, Fransico Him, MD  05/24/2019 9:56 AM    [place]

## 2019-05-24 ENCOUNTER — Telehealth (INDEPENDENT_AMBULATORY_CARE_PROVIDER_SITE_OTHER): Payer: Medicaid Other | Admitting: Cardiology

## 2019-05-24 ENCOUNTER — Encounter: Payer: Self-pay | Admitting: Cardiology

## 2019-05-24 ENCOUNTER — Telehealth: Payer: Self-pay

## 2019-05-24 VITALS — Ht 68.0 in | Wt 180.0 lb

## 2019-05-24 DIAGNOSIS — U071 COVID-19: Secondary | ICD-10-CM

## 2019-05-24 DIAGNOSIS — R079 Chest pain, unspecified: Secondary | ICD-10-CM

## 2019-05-24 DIAGNOSIS — O139 Gestational [pregnancy-induced] hypertension without significant proteinuria, unspecified trimester: Secondary | ICD-10-CM | POA: Insufficient documentation

## 2019-05-24 DIAGNOSIS — O133 Gestational [pregnancy-induced] hypertension without significant proteinuria, third trimester: Secondary | ICD-10-CM

## 2019-05-24 NOTE — Patient Instructions (Signed)
Medication Instructions:  °Your physician recommends that you continue on your current medications as directed. Please refer to the Current Medication list given to you today. ° °*If you need a refill on your cardiac medications before your next appointment, please call your pharmacy* ° °Testing/Procedures: °Your physician has requested that you have an echocardiogram. Echocardiography is a painless test that uses sound waves to create images of your heart. It provides your doctor with information about the size and shape of your heart and how well your heart’s chambers and valves are working. This procedure takes approximately one hour. There are no restrictions for this procedure. ° °Follow-Up: °At [facility], you and your health needs are our priority.  As part of our continuing mission to provide you with exceptional heart care, we have created designated Provider Care Teams.  These Care Teams include your primary Cardiologist (physician) and Advanced Practice Providers (APPs -  Physician Assistants and Nurse Practitioners) who all work together to provide you with the care you need, when you need it. ° °We recommend signing up for the patient portal called "MyChart".  Sign up information is provided on this After Visit Summary.  MyChart is used to connect with patients for Virtual Visits (Telemedicine).  Patients are able to view lab/test results, encounter notes, upcoming appointments, etc.  Non-urgent messages can be sent to your provider as well.   °To learn more about what you can do with MyChart, go to [email or web address].com.   ° °Follow up with Dr. Turner as needed based on results of testing.  ° ° ° °

## 2019-05-24 NOTE — Telephone Encounter (Signed)
  Patient Consent for Virtual Visit         April Fox has provided verbal consent on 05/24/2019 for a virtual visit (video or telephone).   CONSENT FOR VIRTUAL VISIT FOR:  April Fox  By participating in this virtual visit I agree to the following:  I hereby voluntarily request, consent and authorize [facility] and its employed or contracted physicians, physician assistants, nurse practitioners or other licensed health care professionals (the Practitioner), to provide me with telemedicine health care services (the "Services") as deemed necessary by the treating Practitioner. I acknowledge and consent to receive the Services by the Practitioner via telemedicine. I understand that the telemedicine visit will involve communicating with the Practitioner through live audiovisual communication technology and the disclosure of certain medical information by electronic transmission. I acknowledge that I have been given the opportunity to request an in-person assessment or other available alternative prior to the telemedicine visit and am voluntarily participating in the telemedicine visit.  I understand that I have the right to withhold or withdraw my consent to the use of telemedicine in the course of my care at any time, without affecting my right to future care or treatment, and that the Practitioner or I may terminate the telemedicine visit at any time. I understand that I have the right to inspect all information obtained and/or recorded in the course of the telemedicine visit and may receive copies of available information for a reasonable fee.  I understand that some of the potential risks of receiving the Services via telemedicine include:  Marland Kitchen Delay or interruption in medical evaluation due to technological equipment failure or disruption; . Information transmitted may not be sufficient (e.g. poor resolution of images) to allow for appropriate medical decision making by the Practitioner;  and/or  . In rare instances, security protocols could fail, causing a breach of personal health information.  Furthermore, I acknowledge that it is my responsibility to provide information about my medical history, conditions and care that is complete and accurate to the best of my ability. I acknowledge that Practitioner's advice, recommendations, and/or decision may be based on factors not within their control, such as incomplete or inaccurate data provided by me or distortions of diagnostic images or specimens that may result from electronic transmissions. I understand that the practice of medicine is not an exact science and that Practitioner makes no warranties or guarantees regarding treatment outcomes. I acknowledge that a copy of this consent can be made available to me via my patient portal [facility] MyChart), or I can request a printed copy by calling the office of [facility].    I understand that my insurance will be billed for this visit.   I have read or had this consent read to me. . I understand the contents of this consent, which adequately explains the benefits and risks of the Services being provided via telemedicine.  . I have been provided ample opportunity to ask questions regarding this consent and the Services and have had my questions answered to my satisfaction. . I give my informed consent for the services to be provided through the use of telemedicine in my medical care

## 2019-05-24 NOTE — Addendum Note (Signed)
Addended by: Theresia Majors on: 05/24/2019 10:07 AM   Modules accepted: Orders

## 2019-05-24 NOTE — Telephone Encounter (Signed)
Left message for patient to call back. Her appointment at 10:00 am with Dr. Mayford Knife needs to be changed to virtual.

## 2019-06-10 ENCOUNTER — Ambulatory Visit (INDEPENDENT_AMBULATORY_CARE_PROVIDER_SITE_OTHER): Payer: Medicaid Other

## 2019-06-10 ENCOUNTER — Ambulatory Visit (HOSPITAL_COMMUNITY): Payer: Medicaid Other | Attending: Cardiovascular Disease

## 2019-06-10 ENCOUNTER — Other Ambulatory Visit: Payer: Self-pay

## 2019-06-10 VITALS — BP 122/71 | HR 62

## 2019-06-10 DIAGNOSIS — U071 COVID-19: Secondary | ICD-10-CM | POA: Insufficient documentation

## 2019-06-10 DIAGNOSIS — R079 Chest pain, unspecified: Secondary | ICD-10-CM | POA: Insufficient documentation

## 2019-06-10 NOTE — Progress Notes (Signed)
Pt came in for an EKG today for post COVID chest discomfort noted on her recent televisit with Dr.Turner. Pt also had an Echo done today.   Pt is chest pain free today. Says she has been feeling well except for some fatigue.   EKG NSR R62 okay per Dr. Excell Seltzer the DOD.

## 2021-01-25 ENCOUNTER — Emergency Department (HOSPITAL_COMMUNITY): Payer: Medicaid Other

## 2021-01-25 ENCOUNTER — Emergency Department (HOSPITAL_COMMUNITY)
Admission: EM | Admit: 2021-01-25 | Discharge: 2021-01-25 | Disposition: A | Discharge status: Home / Self Care | Payer: Medicaid Other | Attending: Emergency Medicine | Admitting: Emergency Medicine

## 2021-01-25 ENCOUNTER — Encounter (HOSPITAL_COMMUNITY): Payer: Self-pay | Admitting: Emergency Medicine

## 2021-01-25 DIAGNOSIS — W19XXXA Unspecified fall, initial encounter: Secondary | ICD-10-CM

## 2021-01-25 DIAGNOSIS — W109XXA Fall (on) (from) unspecified stairs and steps, initial encounter: Secondary | ICD-10-CM | POA: Diagnosis not present

## 2021-01-25 DIAGNOSIS — M545 Low back pain, unspecified: Secondary | ICD-10-CM | POA: Diagnosis not present

## 2021-01-25 LAB — CBG MONITORING, ED: Glucose-Capillary: 85 mg/dL (ref 70–99)

## 2021-01-25 LAB — CBC
HCT: 44.6 % (ref 36.0–46.0)
Hemoglobin: 14.5 g/dL (ref 12.0–15.0)
MCH: 29.1 pg (ref 26.0–34.0)
MCHC: 32.5 g/dL (ref 30.0–36.0)
MCV: 89.4 fL (ref 80.0–100.0)
Platelets: 261 10*3/uL (ref 150–400)
RBC: 4.99 MIL/uL (ref 3.87–5.11)
RDW: 14.1 % (ref 11.5–15.5)
WBC: 9.5 10*3/uL (ref 4.0–10.5)
nRBC: 0 % (ref 0.0–0.2)

## 2021-01-25 LAB — BASIC METABOLIC PANEL
Anion gap: 4 — ABNORMAL LOW (ref 5–15)
BUN: 17 mg/dL (ref 6–20)
CO2: 21 mmol/L — ABNORMAL LOW (ref 22–32)
Calcium: 8.4 mg/dL — ABNORMAL LOW (ref 8.9–10.3)
Chloride: 108 mmol/L (ref 98–111)
Creatinine, Ser: 0.69 mg/dL (ref 0.44–1.00)
GFR, Estimated: >60 mL/min (ref >60)
Glucose, Bld: 95 mg/dL (ref 70–99)
Potassium: 5.8 mmol/L — ABNORMAL HIGH (ref 3.5–5.1)
Sodium: 133 mmol/L — ABNORMAL LOW (ref 135–145)

## 2021-01-25 LAB — I-STAT BETA HCG BLOOD, ED (MC, WL, AP ONLY): I-stat hCG, quantitative: <5 m[IU]/mL (ref <5)

## 2021-01-25 MED ORDER — OXYCODONE-ACETAMINOPHEN 5-325 MG PO TABS
1.0000 | ORAL_TABLET | Freq: Once | ORAL | Status: AC
  Administered 2021-01-25: 16:00:00 1 via ORAL
  Filled 2021-01-25: qty 1

## 2021-01-25 MED ORDER — SODIUM CHLORIDE 0.9 % IV SOLN: INTRAVENOUS | Status: DC

## 2021-01-25 MED ORDER — IBUPROFEN 800 MG PO TABS: 800.0000 mg | ORAL_TABLET | Freq: Three times a day (TID) | ORAL | 0 refills | Status: DC

## 2021-01-25 NOTE — Discharge Instructions (Addendum)
You were seen in the emergency department today after a fall.  While you are here we take an image of your low back and sacrum which was negative for fractures.  While you are here you may have been sensitive to the Percocet that we gave you which caused you to be lethargic and sweaty. We gave you 1 liter of fluids and obtained some lab work which was all normal. This is reassuring. Please return to the emergency department for any further episodes of sweating or increased lethargy.

## 2021-01-25 NOTE — ED Notes (Signed)
Patient offered a beverage. °

## 2021-01-25 NOTE — ED Provider Notes (Signed)
°  Face-to-face evaluation   History: She presented for evaluation of low back and tailbone pain after fall, 2 days ago.  Following treatment in the ED, she began to feel dizzy and weak.  She became hypotensive and was placed on a examining stretcher.  Physical exam: Alert, calm, cooperative.  No dysarthria or aphasia.  No respiratory distress.  Pupils are 3 mm and reactive bilaterally.  She is responsive and lucid.  MDM: Evaluation for  Chief Complaint  Patient presents with   Fall   Back Pain     Patient presents for evaluation of subacute injury, to the lower back.  After treatment in the ED she had decompensation with weakness and near syncope.  Patient remains lucid and cooperative during this evaluation.  She did not have respiratory or cardiac collapse.  Patient improved quickly after being placed supine and is unlikely to need comprehensive interventions.  Symptomatic treatment is indicated.  Medical screening examination/treatment/procedure(s) were conducted as a shared visit with non-physician practitioner(s) and myself.  I personally evaluated the patient during the encounter    Mancel Bale, MD 01/26/21 1003

## 2021-01-25 NOTE — ED Triage Notes (Signed)
Patient here from home reporting fall last night hitting lower back on stairs. Denies hitting head. Ambulatory.

## 2021-01-25 NOTE — ED Notes (Signed)
Patient called out complaining of dizziness. Noted to be diaphoretic. CBG obtained and PA called to beside.

## 2021-01-25 NOTE — ED Provider Notes (Signed)
[facility]-EMERGENCY DEPT Provider Note   CSN: [id number] Arrival date & time: 01/25/21  1355     History Chief Complaint  Patient presents with   Fall   Back Pain    April Fox is a 30 y.o. female.  With past medical history of anemia who presents to the emergency department with fall.  He states yesterday she was walking down the stairs when she slipped and fell.  She states that she landed directly onto her low back and slid about 8 stairs.  She states that she denies hitting her head or loss of consciousness.  She states that since falling she had immediate pain and then has had ongoing lumbar and sacral pain.  She states that it is painful to walk.  She states that last night she had difficulty finding a comfortable position to lay in bed.  She is unable to sit directly onto her bottom.  She denies any numbness, tingling or weakness to her lower extremities.  She denies fever, saddle anesthesia, weakness, incontinence of bowel or bladder, IV drug use.  Fall  Back Pain     Past Medical History:  Diagnosis Date   Anemia    Hypertension of pregnancy, transient     Patient Active Problem List   Diagnosis Date Noted   Hypertension of pregnancy, transient    Positive GBS test 12/02/2018   Anemia 03/05/2018   Normal labor 06/27/2016    Past Surgical History:  Procedure Laterality Date   NO PAST SURGERIES       OB History     Gravida  2   Para  2   Term  2   Preterm      AB      Living  2      SAB      IAB      Ectopic      Multiple  0   Live Births  2           Family History  Problem Relation Age of Onset   CAD Neg Hx     Social History   Tobacco Use   Smoking status: Never   Smokeless tobacco: Never  Vaping Use   Vaping Use: Never used  Substance Use Topics   Alcohol use: No   Drug use: No    Home Medications Prior to Admission medications   Medication Sig Start Date End Date Taking? Authorizing  Provider  acetaminophen (TYLENOL) 325 MG tablet Take 2 tablets (650 mg total) by mouth every 4 (four) hours as needed (for pain scale < 4). 12/04/18   Leftwich-Kirby, Wilmer Floor, CNM  ferrous sulfate 325 (65 FE) MG tablet Take 1 tablet (325 mg total) by mouth every other day. 12/05/18 05/24/19  Leftwich-Kirby, Wilmer Floor, CNM  ibuprofen (ADVIL) 600 MG tablet Take 1 tablet (600 mg total) by mouth every 6 (six) hours. 12/04/18   Leftwich-Kirby, Wilmer Floor, CNM  Prenatal Vit-Fe Fumarate-FA (PRENATAL MULTIVITAMIN) TABS tablet Take 1 tablet by mouth daily at 12 noon.    [provider]    Allergies    Patient has no known allergies.  Review of Systems   Review of Systems  Musculoskeletal:  Positive for back pain and gait problem. Negative for neck pain and neck stiffness.  All other systems reviewed and are negative.  Physical Exam Updated Vital Signs BP 124/85 (BP Location: Left Arm)    Pulse 88    Temp 98.7 F (37.1 C) (  Oral)    Resp 18    SpO2 95%   Physical Exam Vitals and nursing note reviewed.  Constitutional:      General: She is not in acute distress.    Appearance: Normal appearance. She is not ill-appearing or toxic-appearing.  HENT:     Head: Normocephalic and atraumatic.  Eyes:     General: No scleral icterus. Pulmonary:     Effort: Pulmonary effort is normal. No respiratory distress.  Musculoskeletal:        General: Normal range of motion.     Cervical back: Normal range of motion and neck supple. No tenderness.     Lumbar back: Bony tenderness present.       Back:  Skin:    General: Skin is warm and dry.     Capillary Refill: Capillary refill takes less than 2 seconds.     Findings: No bruising or rash.  Neurological:     General: No focal deficit present.     Mental Status: She is alert and oriented to person, place, and time. Mental status is at baseline.     Motor: No weakness.  Psychiatric:        Mood and Affect: Mood normal.        Behavior: Behavior normal.         Thought Content: Thought content normal.        Judgment: Judgment normal.    ED Results / Procedures / Treatments   Labs (all labs ordered are listed, but only abnormal results are displayed) Labs Reviewed  BASIC METABOLIC PANEL - Abnormal; Notable for the following components:      Result Value   Sodium 133 (*)    Potassium 5.8 (*)    CO2 21 (*)    Calcium 8.4 (*)    Anion gap 4 (*)    All other components within normal limits  CBC  I-STAT BETA HCG BLOOD, ED ([facility], [facility], [facility] ONLY)  CBG MONITORING, ED    EKG None  Radiology CT Lumbar Spine Wo Contrast  Result Date: 01/25/2021 CLINICAL DATA:  Fall.  Trauma low back.  Question sacral fracture. EXAM: CT LUMBAR SPINE WITHOUT CONTRAST TECHNIQUE: Multidetector CT imaging of the lumbar spine was performed without intravenous contrast administration. Multiplanar CT image reconstructions were also generated. COMPARISON:  Lumbar spine radiographs 02/17/2018. FINDINGS: Segmentation: 5 non rib-bearing lumbar type vertebral bodies are present. The lowest fully formed vertebral body is L5. Alignment: No significant listhesis is present. Straightening of the normal lumbar lordosis is stable. Vertebrae: Vertebral body heights are normal. No acute or healing fractures are present. Sacrum is intact. No focal osseous lesions are present. Paraspinal and other soft tissues: Limited imaging the abdomen is unremarkable. There is no significant adenopathy. No solid organ lesions are present. Disc levels: No significant focal disc disease or stenosis is present. IMPRESSION: 1. No acute or healing fractures. 2. Stable straightening of the normal lumbar lordosis. This may be positional or related to muscle spasm. Electronically Signed   By: Marin Roberts M.D.   On: 01/25/2021 17:30    Procedures Procedures   Medications Ordered in ED Medications  0.9 %  sodium chloride infusion ( Intravenous New Bag/Given 01/25/21 1803)  oxyCODONE-acetaminophen  (PERCOCET/ROXICET) 5-325 MG per tablet 1 tablet (1 tablet Oral Given 01/25/21 1620)    ED Course  I have reviewed the triage vital signs and the nursing notes.  Pertinent labs & imaging results that were available during my care of the  patient were reviewed by me and considered in my medical decision making (see chart for details).    MDM Rules/Calculators/A&P 30 year old female who presents to the emergency department after fall.  Patient has pain over the L-spine and sacrum.  Obtain CT L-spine which shows no acute fractures over the L-spine or sacrum.  Likely contusion. Patient was given oxycodone-acetaminophen 5/325 mg.  1745: Called to the bedside as the patient is diaphoretic and appears lethargic.  She states she feels dizzy and like she cannot take deep breaths.  Question opioid given Percocet and opioid nave.  Denies chest pain or shortness of breath, abdominal pain.  She is not pregnant.  Initial BP 90s over 60s -Ordered IV, 1 L fluids, BMP and CBC, EKG. BG 85. Patient placed in Trendelenburg with slight improvement in blood pressures.  Heart rate 60.  She is presenting adequately with O2 sat 98% on room air.  We will continue to monitor her.  -On reassessment the patient is doing much better.  She feels improved after 1 L IV fluids.  Given food and water and tolerating p.o. at this time.  Previous episode likely related to being opioid nave and receiving Percocet.  We will not discharged with any opioids.  She can continue to take Motrin and Tylenol as needed for relief of pain.  Instructed her to return to the emergency department for any further episodes. She verbalized understanding.  Safe for discharge.  Being discharged with significant other who is driving.  Final Clinical Impression(s) / ED Diagnoses Final diagnoses:  Fall, initial encounter    Rx / DC Orders ED Discharge Orders     None        Cristopher Peru, PA-C 01/25/21 Michaell Cowing,  MD 01/26/21 1004

## 2021-03-27 IMAGING — US US MFM OB COMP + 14 WK
1 series · 13 of 28 positions shown · non-contrast
Comparison: none

[Series 1: us mfm ob comp + 14 wk · 83 acquisitions, 13 frames shown]
[im 4/83]
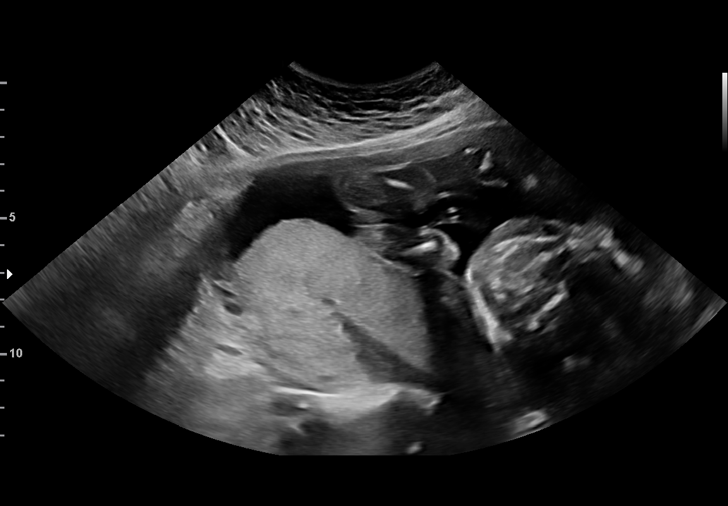
[im 10/83]
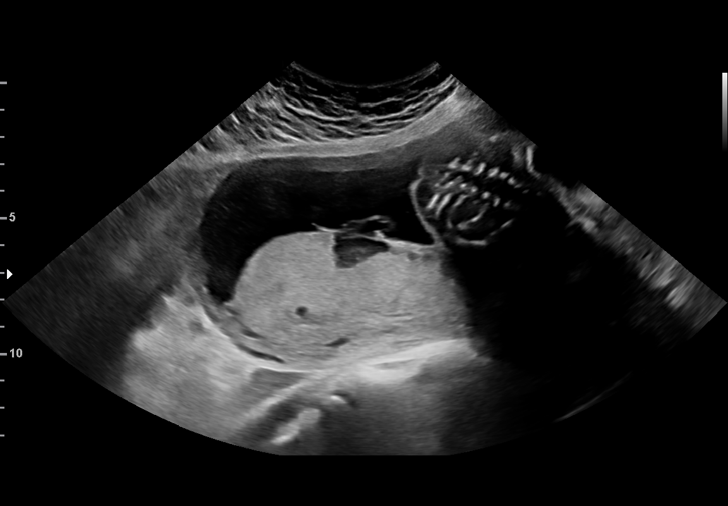
[im 16/83]
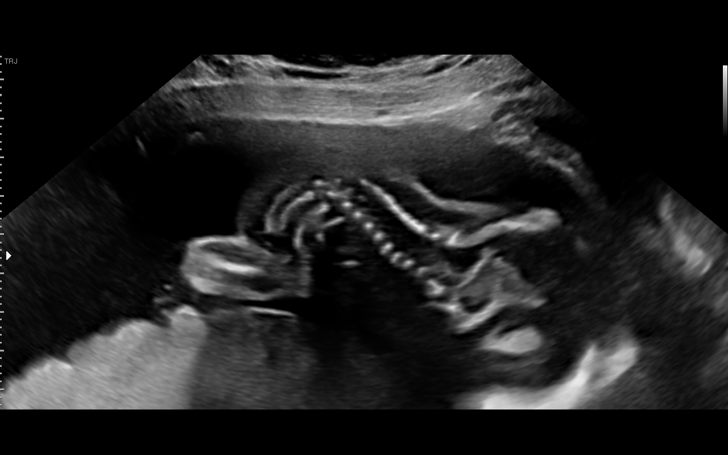
[im 22/83]
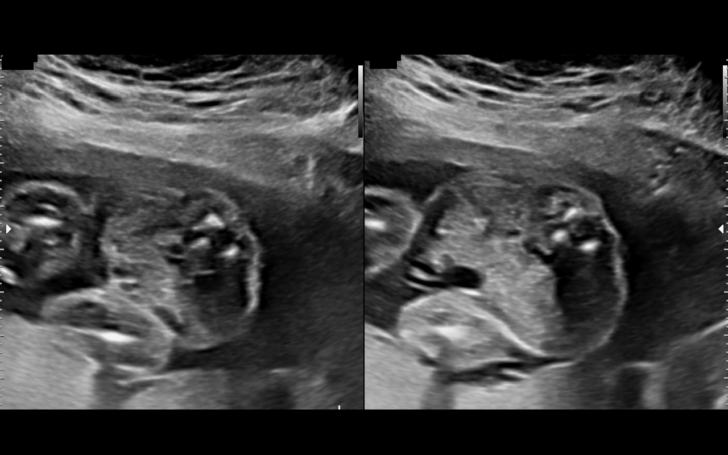
[im 28/83]
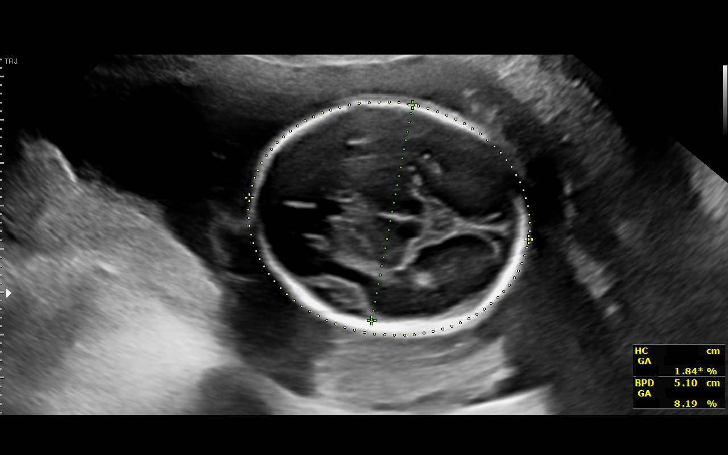
[im 34/83]
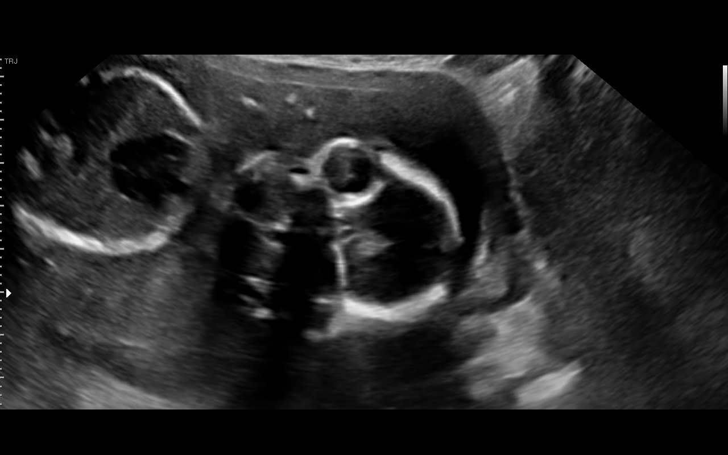
[im 43/83]
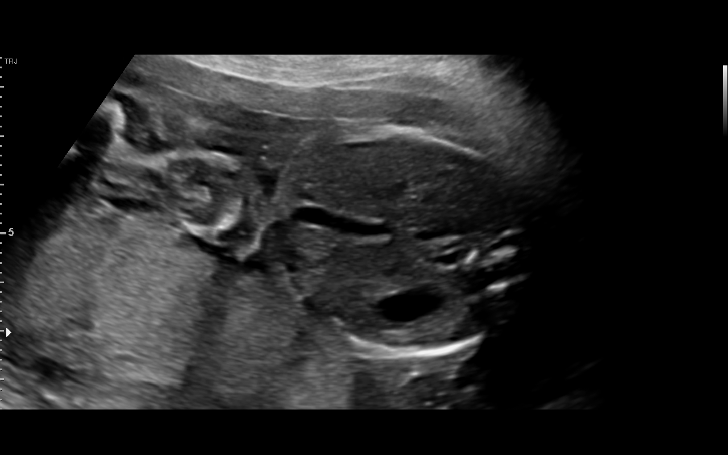
[im 49/83]
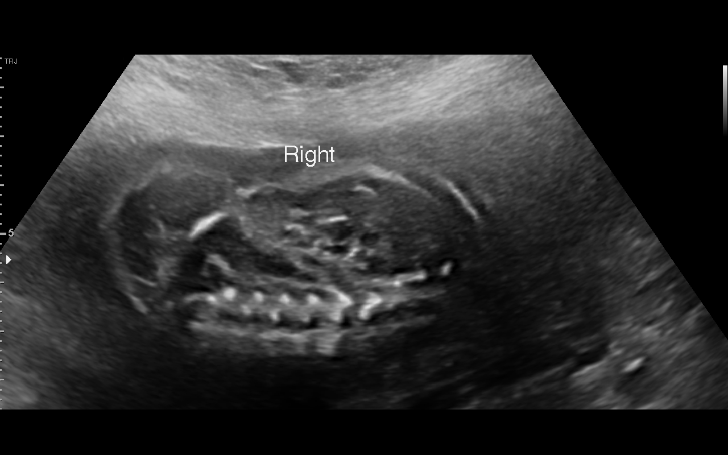
[im 55/83]
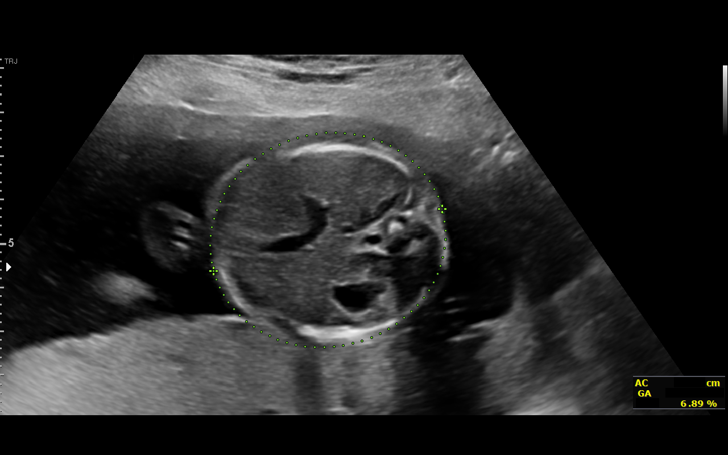
[im 61/83]
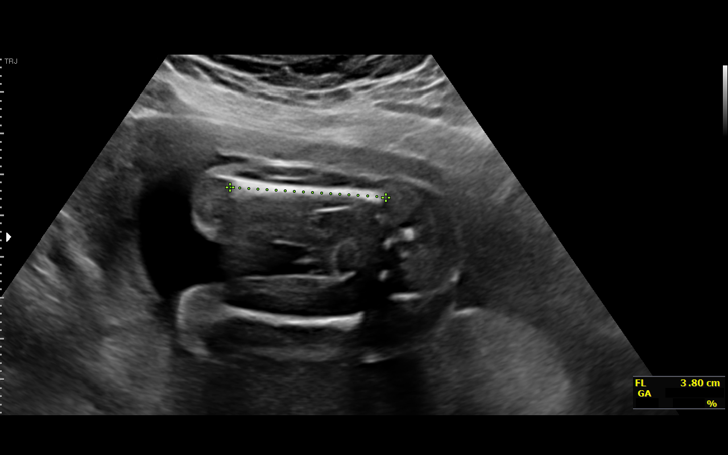
[im 67/83]
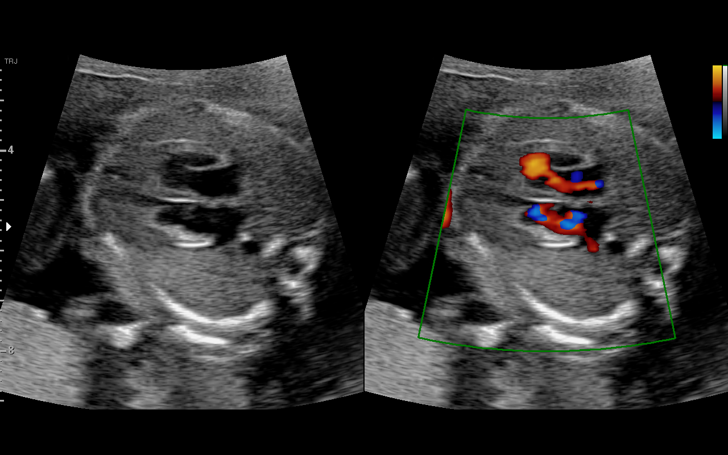
[im 73/83]
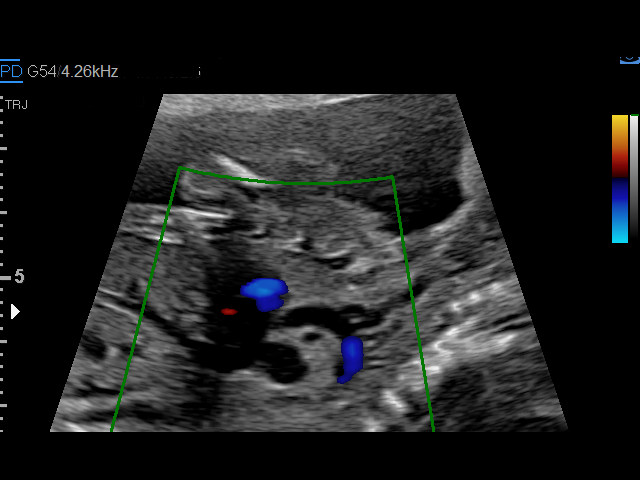
[im 79/83]
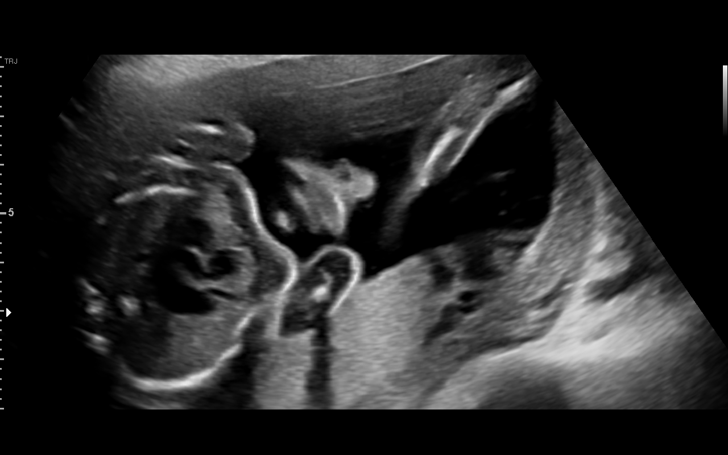

[13 of 28 positions shown; findings below may reference images not displayed]

[REDACTED]-
                   Faculty Physician

 ----------------------------------------------------------------------

 ----------------------------------------------------------------------
Indications

  Encounter for fetal growth retardation
  Encounter for antenatal screening for
  malformations (no NIPS)
  21 weeks gestation of pregnancy
 ----------------------------------------------------------------------
Fetal Evaluation

 Num Of Fetuses:         1
 Fetal Heart Rate(bpm):  140
 Cardiac Activity:       Observed
 Presentation:           Cephalic
 Placenta:               Posterior
 P. Cord Insertion:      Visualized, central

 Amniotic Fluid
 AFI FV:      Within normal limits

                             Largest Pocket(cm)

Biometry

 BPD:      50.6  mm     G. Age:  21w 2d         38  %    CI:        71.02   %    70 - 86
                                                         FL/HC:      19.7   %    18.4 -
 HC:      191.3  mm     G. Age:  21w 3d         31  %    HC/AC:      1.18        1.06 -
 AC:      161.8  mm     G. Age:  21w 2d         33  %    FL/BPD:     74.5   %    71 - 87
 FL:       37.7  mm     G. Age:  22w 0d         57  %    FL/AC:      23.3   %    20 - 24
 HUM:      34.8  mm     G. Age:  21w 6d         57  %
 CER:        23  mm     G. Age:  21w 4d         47  %
 LV:        6.5  mm
 CM:        3.3  mm
 Est. FW:     435  gm    0 lb 15 oz      45  %
OB History

 Gravidity:    2         Term:   1
 Living:       1
Gestational Age

 LMP:           22w 5d        Date:  02/18/18                 EDD:   11/25/18
 U/S Today:     21w 4d                                        EDD:   12/03/18
 Best:          21w 4d     Det. By:  U/S (07/27/18)           EDD:   12/03/18
Anatomy

 Cranium:               Appears normal         LVOT:                   Appears normal
 Cavum:                 Appears normal         Aortic Arch:            Appears normal
 Ventricles:            Appears normal         Ductal Arch:            Appears normal
 Choroid Plexus:        Appears normal         Diaphragm:              Appears normal
 Cerebellum:            Appears normal         Stomach:                Appears normal, left
                                                                       sided
 Posterior Fossa:       Appears normal         Abdomen:                Appears normal
 Nuchal Fold:           Not applicable (>20    Abdominal Wall:         Appears nml (cord
                        wks GA)                                        insert, abd wall)
 Face:                  Appears normal         Cord Vessels:           Appears normal (3
                        (orbits and profile)                           vessel cord)
 Lips:                  Appears normal         Kidneys:                Appear normal
 Palate:                Appears normal         Bladder:                Appears normal
 Thoracic:              Appears normal         Spine:                  Appears normal
 Heart:                 Appears normal         Upper Extremities:      Appears normal
                        (4CH, axis, and
                        situs)
 RVOT:                  Appears normal         Lower Extremities:      Appears normal

 Other:  Fetus appears to be a male. Nasal bone visualized. Heels and 5th
         digit visualized. Open hands visualized. 3VV
Cervix Uterus Adnexa

 Cervix
 Length:           3.91  cm.
 Normal appearance by transabdominal scan.

 Left Ovary
 Within normal limits.

 Right Ovary
 Within normal limits.
Impression

 Ms. Arruda, Romy2 P1 at 22w 5d gestation, is here for second
 opinion. On your office ultrasound performed on 07/05/18, the
 estimated fetal weight was at the 6th percentile. Gestational
 age is established by sure LMP date.
 Obstetric history is significant for a term vaginal delivery in
 5340 of a female infant weighing 3,220 grams (no evidence
 of fetal growth restriction).
 She reports no chronic medical conditions.
 We performed a fetal anatomy scan. No markers of
 aneuploidies or fetal structural defects are seen. Fetal
 biometry is consistent with her previous ultrasound-
 established dates. It lags GA (by LMP) by 8 days and the
 estimated fetal weight is at the 6th percentile. Amniotic fluid is
 normal and good fetal activity is seen. Patient understands
 the limitations of ultrasound in detecting fetal anomalies.

 I spoke with the patient on phone after she left. Patient
 reports she had Nexplanon removed 6 months ago and since
 then she had irregular cycles. Her LMP was on 02/18/18 and
 her previous one was on 01/09/18. She reports longer cycles
 in between regular ones.

 I am reluctant to establish her EDD on the basis of her LMP
 date. Our ultrasound biometry performed 3 weeks after your
 ultrasound showed good interval growth.
 I recommend we reassign her EDD at 12/03/18 established
 by her first ultrasound (07/05/18).

 Patient also reports she has not had screening for fetal
 aneuploidies. Her GA is beyond that for conventional
 screening. You can offer cell-free fetal DNA screening.
Recommendations

 -An appointment was made for her to return in 4 weeks for
 interval growth assessment.
 -Patient may be offered cell-free fetal DNA screening at your
 office ([employer] by [REDACTED] or [employer] by [employer]).
                 Jim, Drey

## 2021-08-24 ENCOUNTER — Emergency Department (HOSPITAL_COMMUNITY)
Admission: EM | Admit: 2021-08-24 | Discharge: 2021-08-24 | Disposition: A | Discharge status: Home / Self Care | Payer: Medicaid Other | Attending: Emergency Medicine | Admitting: Emergency Medicine

## 2021-08-24 ENCOUNTER — Emergency Department (HOSPITAL_COMMUNITY): Payer: Medicaid Other

## 2021-08-24 ENCOUNTER — Other Ambulatory Visit: Payer: Self-pay

## 2021-08-24 ENCOUNTER — Encounter (HOSPITAL_COMMUNITY): Payer: Self-pay

## 2021-08-24 DIAGNOSIS — S9002XA Contusion of left ankle, initial encounter: Secondary | ICD-10-CM | POA: Diagnosis not present

## 2021-08-24 DIAGNOSIS — W208XXA Other cause of strike by thrown, projected or falling object, initial encounter: Secondary | ICD-10-CM | POA: Diagnosis not present

## 2021-08-24 DIAGNOSIS — S99912A Unspecified injury of left ankle, initial encounter: Secondary | ICD-10-CM | POA: Diagnosis present

## 2021-08-24 MED ORDER — NAPROXEN 500 MG PO TABS
500.0000 mg | ORAL_TABLET | Freq: Once | ORAL | Status: AC
  Administered 2021-08-24: 500 mg via ORAL
  Filled 2021-08-24: qty 1

## 2021-08-24 MED ORDER — TRAMADOL HCL 50 MG PO TABS
50.0000 mg | ORAL_TABLET | Freq: Once | ORAL | Status: AC
  Administered 2021-08-24: 50 mg via ORAL
  Filled 2021-08-24: qty 1

## 2021-08-24 NOTE — ED Provider Notes (Signed)
[facility]-EMERGENCY DEPT Provider Note   CSN: [id number] Arrival date & time: 08/24/21  1839     History  Chief Complaint  Patient presents with   Ankle Injury    Al Gagen is a 31 y.o. female.  31 year old female presents to the emergency department for evaluation of left ankle pain.  Pain has been constant, unchanged.  It is aggravated with weightbearing.  Symptoms began after patient dropped a ping-pong table on her left ankle.  This occurred at 1800.  She has not taken any medications for her pain since onset.  The history is provided by the patient. No language interpreter was used.  Ankle Injury       Home Medications Prior to Admission medications   Medication Sig Start Date End Date Taking? Authorizing Provider  acetaminophen (TYLENOL) 325 MG tablet Take 2 tablets (650 mg total) by mouth every 4 (four) hours as needed (for pain scale < 4). 12/04/18   Leftwich-Kirby, Wilmer Floor, CNM  ferrous sulfate 325 (65 FE) MG tablet Take 1 tablet (325 mg total) by mouth every other day. 12/05/18 05/24/19  Leftwich-Kirby, Wilmer Floor, CNM  ibuprofen (ADVIL) 800 MG tablet Take 1 tablet (800 mg total) by mouth 3 (three) times daily. 01/25/21   Cristopher Peru, PA-C  Prenatal Vit-Fe Fumarate-FA (PRENATAL MULTIVITAMIN) TABS tablet Take 1 tablet by mouth daily at 12 noon.    [provider]      Allergies    Patient has no known allergies.    Review of Systems   Review of Systems Ten systems reviewed and are negative for acute change, except as noted in the HPI.    Physical Exam Updated Vital Signs BP 127/82 (BP Location: Left Arm)   Pulse 64   Temp 98.3 F (36.8 C) (Oral)   Resp 18   Ht 5\' 6"  (1.676 m)   Wt 81.6 kg   SpO2 98%   BMI 29.05 kg/m   Physical Exam Vitals and nursing note reviewed.  Constitutional:      General: She is not in acute distress.    Appearance: She is well-developed. She is not diaphoretic.     Comments: Nontoxic  appearing and in NAD  HENT:     Head: Normocephalic and atraumatic.  Eyes:     General: No scleral icterus.    Conjunctiva/sclera: Conjunctivae normal.  Cardiovascular:     Rate and Rhythm: Normal rate and regular rhythm.     Pulses: Normal pulses.     Comments: DP pulse 2+ in the left lower extremity Pulmonary:     Effort: Pulmonary effort is normal. No respiratory distress.     Comments: Respirations even and unlabored Musculoskeletal:        General: Swelling and signs of injury present. No deformity.     Cervical back: Normal range of motion.     Comments: Swelling and erythema to the left ankle with superficial abrasion to the anterior aspect of the joint.  No significant deformity.  Skin:    General: Skin is warm and dry.     Coloration: Skin is not pale.     Findings: No rash.  Neurological:     Mental Status: She is alert and oriented to person, place, and time.     Coordination: Coordination normal.     Comments: Sensation to light touch intact in the distal left lower extremity and foot.  Able to wiggle all toes.  Psychiatric:  Behavior: Behavior normal.     ED Results / Procedures / Treatments   Labs (all labs ordered are listed, but only abnormal results are displayed) Labs Reviewed - No data to display  EKG None  Radiology DG Ankle Complete Left  Result Date: 08/24/2021 CLINICAL DATA:  Patient dropped a ping pong table on her left ankle. EXAM: LEFT ANKLE COMPLETE - 3+ VIEW COMPARISON:  None Available. FINDINGS: There is no evidence of fracture, dislocation, or joint effusion. There is no evidence of arthropathy or other focal bone abnormality. Mild soft tissue swelling about the ankle, with subcutaneous edema noted at the dorsal aspect of the ankle joint. IMPRESSION: 1. No acute osseous abnormality. 2. Mild soft tissue swelling about the ankle. Electronically Signed   By: Sherron Ales M.D.   On: 08/24/2021 20:19    Procedures Procedures    Medications  Ordered in ED Medications  naproxen (NAPROSYN) tablet 500 mg (has no administration in time range)  traMADol (ULTRAM) tablet 50 mg (has no administration in time range)    ED Course/ Medical Decision Making/ A&P                           Medical Decision Making Amount and/or Complexity of Data Reviewed Radiology: ordered.  Risk Prescription drug management.   This patient presents to the ED for concern of ankle pain, this involves an extensive number of treatment options, and is a complaint that carries with it a high risk of complications and morbidity.  The differential diagnosis includes contusion vs sprain vs fx   Co morbidities that complicate the patient evaluation  HTN Anemia    Imaging Studies ordered:  I ordered imaging studies including L ankle Xray  I independently visualized and interpreted imaging which showed no acute bony abnormality; no fx or dislocation I agree with the radiologist interpretation   Medicines ordered and prescription drug management:  I ordered medication including tramadol and naproxen for pain  Reevaluation of the patient after these medicines showed that the patient improved I have reviewed the patients home medicines and have made adjustments as needed   Critical Interventions:  ACE wrap applied for stability Crutches given   Problem List / ED Course:  Patient neurovascularly intact on exam.  Imaging negative for fracture, dislocation, bony deformity. No swelling, erythema, heat to touch to the affected area; no concern for septic joint.  Compartments in the affected extremity are soft.    Reevaluation:  After the interventions noted above, I reevaluated the patient and found that they have :stayed the same   Social Determinants of Health:  Insured Established care with PCP   Dispostion:  After consideration of the diagnostic results and the patients response to treatment, I feel that the patent would benefit from  supportive management including RICE and NSAIDs; primary care follow up as needed. ACE wrap applied and crutches given for WBAT. Return precautions discussed and provided. Patient discharged in stable condition with no unaddressed concerns..          Final Clinical Impression(s) / ED Diagnoses Final diagnoses:  Contusion of left ankle, initial encounter    Rx / DC Orders ED Discharge Orders     None         Antony Madura, PA-C 08/24/21 2328    Palumbo, April, MD 08/25/21 0124

## 2021-08-24 NOTE — ED Triage Notes (Signed)
Pt reports dropping a ping pong table on left ankle tonight around 6PM tonight.

## 2021-08-24 NOTE — Discharge Instructions (Addendum)
Apply ice to areas of injury 3-4 times per day to limit swelling.  We recommend the use of ibuprofen 600 mg every 6 hours for pain control.  Use crutches as needed to prevent from putting weight on your left foot/ankle.  Follow-up with your primary care doctor in 1 week.  Your primary care doctor can refer you to orthopedics if indicated should pain persist.

## 2021-10-28 ENCOUNTER — Ambulatory Visit (HOSPITAL_BASED_OUTPATIENT_CLINIC_OR_DEPARTMENT_OTHER)
Admission: RE | Admit: 2021-10-28 | Discharge: 2021-10-28 | Disposition: A | Discharge status: Home / Self Care | Payer: Medicaid Other | Source: Ambulatory Visit | Attending: Nurse Practitioner | Admitting: Nurse Practitioner

## 2021-10-28 ENCOUNTER — Other Ambulatory Visit (HOSPITAL_BASED_OUTPATIENT_CLINIC_OR_DEPARTMENT_OTHER): Payer: Self-pay | Admitting: Nurse Practitioner

## 2021-10-28 DIAGNOSIS — R0602 Shortness of breath: Secondary | ICD-10-CM | POA: Insufficient documentation

## 2021-10-28 DIAGNOSIS — R079 Chest pain, unspecified: Secondary | ICD-10-CM | POA: Diagnosis present

## 2021-10-29 ENCOUNTER — Emergency Department (HOSPITAL_COMMUNITY): Payer: Medicaid Other

## 2021-10-29 ENCOUNTER — Emergency Department (HOSPITAL_COMMUNITY)
Admission: EM | Admit: 2021-10-29 | Discharge: 2021-10-29 | Discharge status: Against Medical Advice | Payer: Medicaid Other | Attending: Emergency Medicine | Admitting: Emergency Medicine

## 2021-10-29 ENCOUNTER — Encounter (HOSPITAL_COMMUNITY): Payer: Self-pay

## 2021-10-29 ENCOUNTER — Other Ambulatory Visit: Payer: Self-pay

## 2021-10-29 DIAGNOSIS — R0789 Other chest pain: Secondary | ICD-10-CM | POA: Insufficient documentation

## 2021-10-29 DIAGNOSIS — Z5321 Procedure and treatment not carried out due to patient leaving prior to being seen by health care provider: Secondary | ICD-10-CM | POA: Insufficient documentation

## 2021-10-29 DIAGNOSIS — R110 Nausea: Secondary | ICD-10-CM | POA: Diagnosis not present

## 2021-10-29 LAB — BASIC METABOLIC PANEL
Anion gap: 5 (ref 5–15)
BUN: 14 mg/dL (ref 6–20)
CO2: 26 mmol/L (ref 22–32)
Calcium: 8.8 mg/dL — ABNORMAL LOW (ref 8.9–10.3)
Chloride: 109 mmol/L (ref 98–111)
Creatinine, Ser: 0.78 mg/dL (ref 0.44–1.00)
GFR, Estimated: >60 mL/min (ref >60)
Glucose, Bld: 74 mg/dL (ref 70–99)
Potassium: 3.6 mmol/L (ref 3.5–5.1)
Sodium: 140 mmol/L (ref 135–145)

## 2021-10-29 LAB — TROPONIN I (HIGH SENSITIVITY): Troponin I (High Sensitivity): <2 ng/L (ref <18)

## 2021-10-29 LAB — CBC
HCT: 45 % (ref 36.0–46.0)
Hemoglobin: 14.8 g/dL (ref 12.0–15.0)
MCH: 29.9 pg (ref 26.0–34.0)
MCHC: 32.9 g/dL (ref 30.0–36.0)
MCV: 90.9 fL (ref 80.0–100.0)
Platelets: 276 10*3/uL (ref 150–400)
RBC: 4.95 MIL/uL (ref 3.87–5.11)
RDW: 12.9 % (ref 11.5–15.5)
WBC: 6 10*3/uL (ref 4.0–10.5)
nRBC: 0 % (ref 0.0–0.2)

## 2021-10-29 LAB — I-STAT BETA HCG BLOOD, ED (MC, WL, AP ONLY): I-stat hCG, quantitative: <5 m[IU]/mL (ref <5)

## 2021-10-29 NOTE — ED Triage Notes (Addendum)
Patient c/o intermittent mid and left chest x 2 weeks, but worse today because the pain is now radiating into the left shoulder , left upper back, left neck, and left arm. Patient states th epain is worse with a deep breath. Patient also c/o nausea.

## 2021-10-29 NOTE — ED Provider Triage Note (Signed)
Emergency Medicine Provider Triage Evaluation Note  April Fox , a 31 y.o. female  was evaluated in triage.  Patient reports otherwise healthy takes daily birth control no other medication use presented for chest pain onset 1 day ago, she describes a pressure-like sensation in the center of her chest radiates to her left arm worse with deep breath.  She denies recent illness, she reports associated nausea.  No additional concerns.  Of note patient in a cam boot left foot due to ping-pong table injury.  Review of Systems  Positive: Chest pain, nausea Negative: Fever, chills, cough/hemoptysis, extremity swelling/color change, abdominal pain or additional concerns.  Physical Exam  BP 122/82 (BP Location: Left Arm)   Pulse 74   Temp 98.2 F (36.8 C) (Oral)   Resp 16   Ht 5\' 8"  (1.727 m)   Wt 90.7 kg   LMP 10/19/2021 (Exact Date)   SpO2 98%   BMI 30.41 kg/m  Gen:   Awake, no distress   Resp:  Normal effort  MSK:   Moves extremities without difficulty.  No swelling or tenderness of the lower legs Other:  Heart regular rate and rhythm, lungs clear.  Medical Decision Making  Medically screening exam initiated at 9:56 AM.  Appropriate orders placed.  Jermya Dowding was informed that the remainder of the evaluation will be completed by another provider, this initial triage assessment does not replace that evaluation, and the importance of remaining in the ED until their evaluation is complete.   Note: Portions of this report may have been transcribed using voice recognition software. Every effort was made to ensure accuracy; however, inadvertent computerized transcription errors may still be present.    Deliah Boston, PA-C 10/29/21 [phone]

## 2021-12-04 LAB — LAB REPORT - SCANNED
A1c: 5.8
EGFR: 105

## 2021-12-13 ENCOUNTER — Encounter: Payer: Self-pay | Admitting: Physician Assistant

## 2021-12-13 ENCOUNTER — Ambulatory Visit: Payer: Medicaid Other | Attending: Physician Assistant

## 2021-12-13 ENCOUNTER — Ambulatory Visit: Payer: Medicaid Other | Attending: Physician Assistant | Admitting: Physician Assistant

## 2021-12-13 VITALS — BP 104/80 | HR 73 | Ht 66.0 in | Wt 204.0 lb

## 2021-12-13 DIAGNOSIS — O133 Gestational [pregnancy-induced] hypertension without significant proteinuria, third trimester: Secondary | ICD-10-CM | POA: Diagnosis not present

## 2021-12-13 DIAGNOSIS — R002 Palpitations: Secondary | ICD-10-CM

## 2021-12-13 DIAGNOSIS — R0789 Other chest pain: Secondary | ICD-10-CM

## 2021-12-13 NOTE — Patient Instructions (Signed)
Medication Instructions:  Your physician recommends that you continue on your current medications as directed. Please refer to the Current Medication list given to you today.  *If you need a refill on your cardiac medications before your next appointment, please call your pharmacy*   Lab Work: BMP, CBC, MAG and TSH--TODAY If you have labs (blood work) drawn today and your tests are completely normal, you will receive your results only by: MyChart Message (if you have MyChart) OR A paper copy in the mail If you have any lab test that is abnormal or we need to change your treatment, we will call you to review the results.   Testing/Procedures: Your physician has requested that you have an echocardiogram. Echocardiography is a painless test that uses sound waves to create images of your heart. It provides your doctor with information about the size and shape of your heart and how well your heart's chambers and valves are working. This procedure takes approximately one hour. There are no restrictions for this procedure. Please do NOT wear cologne, perfume, aftershave, or lotions (deodorant is allowed). Please arrive 15 minutes prior to your appointment time.   ZIO XT- Long Term Monitor Instructions  Your physician has requested you wear a ZIO patch monitor for 14 days.  This is a single patch monitor. [employer] supplies one patch monitor per enrollment. Additional stickers are not available. Please do not apply patch if you will be having a Nuclear Stress Test,  Echocardiogram, Cardiac CT, MRI, or Chest Xray during the period you would be wearing the  monitor. The patch cannot be worn during these tests. You cannot remove and re-apply the  ZIO XT patch monitor.  Your ZIO patch monitor will be mailed 3 day USPS to your address on file. It may take 3-5 days  to receive your monitor after you have been enrolled.  Once you have received your monitor, please review the enclosed instructions. Your  monitor  has already been registered assigning a specific monitor serial # to you.  Billing and Patient Assistance Program Information  We have supplied [employer] with any of your insurance information on file for billing purposes. [employer] offers a sliding scale Patient Assistance Program for patients that do not have  insurance, or whose insurance does not completely cover the cost of the ZIO monitor.  You must apply for the Patient Assistance Program to qualify for this discounted rate.  To apply, please call [employer] at [phone], select option 4, select option 2, ask to apply for  Patient Assistance Program. Meredeth Ide will ask your household income, and how many people  are in your household. They will quote your out-of-pocket cost based on that information.  [employer] will also be able to set up a 59-month, interest-free payment plan if needed.  Applying the monitor   Shave hair from upper left chest.  Hold abrader disc by orange tab. Rub abrader in 40 strokes over the upper left chest as  indicated in your monitor instructions.  Clean area with 4 enclosed alcohol pads. Let dry.  Apply patch as indicated in monitor instructions. Patch will be placed under collarbone on left  side of chest with arrow pointing upward.  Rub patch adhesive wings for 2 minutes. Remove white label marked "1". Remove the white  label marked "2". Rub patch adhesive wings for 2 additional minutes.  While looking in a mirror, press and release button in center of patch. A small green light will  flash 3-4 times. This will  be your only indicator that the monitor has been turned on.  Do not shower for the first 24 hours. You may shower after the first 24 hours.  Press the button if you feel a symptom. You will hear a small click. Record Date, Time and  Symptom in the Patient Logbook.  When you are ready to remove the patch, follow instructions on the last 2 pages of Patient  Logbook. Stick patch monitor onto the  last page of Patient Logbook.  Place Patient Logbook in the blue and white box. Use locking tab on box and tape box closed  securely. The blue and white box has prepaid postage on it. Please place it in the mailbox as  soon as possible. Your physician should have your test results approximately 7 days after the  monitor has been mailed back to [facility].  Call [facility] Customer Care at [phone] if you have questions regarding  your ZIO XT patch monitor. Call them immediately if you see an orange light blinking on your  monitor.  If your monitor falls off in less than 4 days, contact our Monitor department at [phone].  If your monitor becomes loose or falls off after 4 days call [employer] at [phone] for  suggestions on securing your monitor    Follow-Up: At [facility], you and your health needs are our priority.  As part of our continuing mission to provide you with exceptional heart care, we have created designated Provider Care Teams.  These Care Teams include your primary Cardiologist (physician) and Advanced Practice Providers (APPs -  Physician Assistants and Nurse Practitioners) who all work together to provide you with the care you need, when you need it.  Your next appointment:   6 week(s)  The format for your next appointment:   In Person  Provider:   Armanda Magic, MD  or APP  Important Information About Sugar

## 2021-12-13 NOTE — Progress Notes (Signed)
Office Visit    Patient Name: April Fox Date of Encounter: 12/13/2021  PCP:  Diamantina Monks, NP   [facility]  Cardiologist:  Armanda Magic, MD  Advanced Practice Provider:  No care team member to display Electrophysiologist:  None    HPI    April Fox is a 31 y.o. female with a past medical history of hypertension during pregnancy, anemia presents today for follow-up visit.  05/24/2019 Dr. Mayford Knife via telemedicine visit.  She had chest pain at that time that also nasal congestio, body aches, and fatigue.  She tested positive for COVID.  She complained of some mild chest pain and chest CT was negative for PE.  EKG was abnormal with diffuse T wave abnormality.  Normal troponin x2.  She has a history of longstanding chest pain surrounding her COVID-19 diagnosis.  She is also having pain in her back and shoulders and pressure in her chest with some radiation into her neck and arms.  She had some nausea and would get diaphoretic with the pain.  Pain was intermittent and there was no change in intensity with movements.  EKG showed diffuse T wave inversions in inferior and anterior leads.  There was questionable acute pericarditis given EKG changes that were diffuse.  2D echocardiogram was ordered to evaluate her pericardium.  Her echocardiogram was normal.  Today, she endorses some chest pain that is in the center of her chest and radiates to the left side.  She describes it as burning, heavy, quick onset and quick offset.  She does have palpitations with the pain most of the time and also endorses the pain when taking a deep breath.  No other associated symptoms.  She did have some chest pain back in 2021 when she tested positive for COVID.  Last echocardiogram was back in 2021.  No further episodes with hypertension since the birth of her most recent baby.  Reports no shortness of breath nor dyspnea on exertion. No edema, orthopnea, PND.   Past Medical  History    Past Medical History:  Diagnosis Date   Anemia    Hypertension of pregnancy, transient    Past Surgical History:  Procedure Laterality Date   NO PAST SURGERIES      Allergies  No Known Allergies   EKGs/Labs/Other Studies Reviewed:   The following studies were reviewed today:  Echocardiogram 06/10/2019  IMPRESSIONS     1. Left ventricular ejection fraction, by estimation, is 60 to 65%. The  left ventricle has normal function. The left ventricle has no regional  wall motion abnormalities. Left ventricular diastolic parameters were  normal.   2. Right ventricular systolic function is normal. The right ventricular  size is normal.   3. The mitral valve is normal in structure. No evidence of mitral valve  regurgitation. No evidence of mitral stenosis.   4. The aortic valve is normal in structure. Aortic valve regurgitation is  not visualized. No aortic stenosis is present.   5. The inferior vena cava is normal in size with greater than 50%  respiratory variability, suggesting right atrial pressure of 3 mmHg.   FINDINGS   Left Ventricle: Left ventricular ejection fraction, by estimation, is 60  to 65%. The left ventricle has normal function. The left ventricle has no  regional wall motion abnormalities. The left ventricular internal cavity  size was normal in size. There is   no left ventricular hypertrophy. Left ventricular diastolic parameters  were normal. Normal left ventricular filling  pressure.   Right Ventricle: The right ventricular size is normal. No increase in  right ventricular wall thickness. Right ventricular systolic function is  normal.   Left Atrium: Left atrial size was normal in size.   Right Atrium: Right atrial size was normal in size.   Pericardium: There is no evidence of pericardial effusion.   Mitral Valve: The mitral valve is normal in structure. Normal mobility of  the mitral valve leaflets. No evidence of mitral valve  regurgitation. No  evidence of mitral valve stenosis.   Tricuspid Valve: The tricuspid valve is normal in structure. Tricuspid  valve regurgitation is not demonstrated. No evidence of tricuspid  stenosis.   Aortic Valve: The aortic valve is normal in structure. Aortic valve  regurgitation is not visualized. No aortic stenosis is present.   Pulmonic Valve: The pulmonic valve was normal in structure. Pulmonic valve  regurgitation is not visualized. No evidence of pulmonic stenosis.   Aorta: The aortic root is normal in size and structure.   Venous: The inferior vena cava is normal in size with greater than 50%  respiratory variability, suggesting right atrial pressure of 3 mmHg.   IAS/Shunts: No atrial level shunt detected by color flow Doppler.   EKG:  EKG is  ordered today.  The ekg ordered today demonstrates normal sinus rhythm, rate 73 bpm  Recent Labs: 10/29/2021: BUN 14; Creatinine, Ser 0.78; Hemoglobin 14.8; Platelets 276; Potassium 3.6; Sodium 140  Recent Lipid Panel No results found for: "CHOL", "TRIG", "HDL", "CHOLHDL", "VLDL", "LDLCALC", "LDLDIRECT"   Home Medications   Current Meds  Medication Sig   acetaminophen (TYLENOL) 325 MG tablet Take 2 tablets (650 mg total) by mouth every 4 (four) hours as needed (for pain scale < 4).   ferrous sulfate 325 (65 FE) MG tablet Take 1 tablet (325 mg total) by mouth every other day.   ibuprofen (ADVIL) 800 MG tablet Take 1 tablet (800 mg total) by mouth 3 (three) times daily.   Prenatal Vit-Fe Fumarate-FA (PRENATAL MULTIVITAMIN) TABS tablet Take 1 tablet by mouth daily at 12 noon.     Review of Systems      All other systems reviewed and are otherwise negative except as noted above.  Physical Exam    VS:  BP 104/80   Pulse 73   Ht 5\' 6"  (1.676 m)   Wt 204 lb (92.5 kg)   BMI 32.93 kg/m  , BMI Body mass index is 32.93 kg/m.  Wt Readings from Last 3 Encounters:  12/13/21 204 lb (92.5 kg)  10/29/21 200 lb (90.7 kg)   08/24/21 180 lb (81.6 kg)     GEN: Well nourished, well developed, in no acute distress. HEENT: normal. Neck: Supple, no JVD, carotid bruits, or masses. Cardiac: RRR, no murmurs, rubs, or gallops. No clubbing, cyanosis, edema.  Radials/PT 2+ and equal bilaterally.  Respiratory:  Respirations regular and unlabored, clear to auscultation bilaterally. GI: Soft, nontender, nondistended. MS: No deformity or atrophy. Skin: Warm and dry, no rash. Neuro:  Strength and sensation are intact. Psych: Normal affect.  Assessment & Plan    Atypical chest pain sometimes with palpitations -Will order echocardiogram to look at her pericardium to rule out pericarditis -Will order a ZIO monitor to rule out SVT -Continue current medication regimen, will hold off on starting a beta-blocker for now -We have ordered labs including CBC, BMP, TSH, and magnesium  Transient hypertension during pregnancy -Blood pressure is normal today, no further issues postpartum  Disposition: Follow up 6 weeks with Armanda Magic, MD or APP.  Signed, Sharlene Dory, PA-C 12/13/2021, 5:14 PM [facility]

## 2021-12-13 NOTE — Progress Notes (Unsigned)
Enrolled patient for a 14 day Zio XT monitor to be mailed to patients home  [place] to read

## 2021-12-14 LAB — BASIC METABOLIC PANEL
BUN/Creatinine Ratio: 21 (ref 9–23)
BUN: 15 mg/dL (ref 6–20)
CO2: 23 mmol/L (ref 20–29)
Calcium: 9.6 mg/dL (ref 8.7–10.2)
Chloride: 103 mmol/L (ref 96–106)
Creatinine, Ser: 0.72 mg/dL (ref 0.57–1.00)
Glucose: 103 mg/dL — ABNORMAL HIGH (ref 70–99)
Potassium: 4.3 mmol/L (ref 3.5–5.2)
Sodium: 138 mmol/L (ref 134–144)
eGFR: 114 mL/min/{1.73_m2} (ref >59)

## 2021-12-14 LAB — MAGNESIUM: Magnesium: 2.1 mg/dL (ref 1.6–2.3)

## 2021-12-14 LAB — CBC
Hematocrit: 44.3 % (ref 34.0–46.6)
Hemoglobin: 15 g/dL (ref 11.1–15.9)
MCH: 30.4 pg (ref 26.6–33.0)
MCHC: 33.9 g/dL (ref 31.5–35.7)
MCV: 90 fL (ref 79–97)
Platelets: 317 10*3/uL (ref 150–450)
RBC: 4.93 x10E6/uL (ref 3.77–5.28)
RDW: 12.1 % (ref 11.7–15.4)
WBC: 7.9 10*3/uL (ref 3.4–10.8)

## 2021-12-14 LAB — TSH: TSH: 1.59 u[IU]/mL (ref 0.450–4.500)

## 2021-12-18 ENCOUNTER — Encounter: Payer: Self-pay | Admitting: *Deleted

## 2021-12-18 DIAGNOSIS — R002 Palpitations: Secondary | ICD-10-CM | POA: Diagnosis not present

## 2021-12-23 ENCOUNTER — Ambulatory Visit (INDEPENDENT_AMBULATORY_CARE_PROVIDER_SITE_OTHER): Payer: Medicaid Other | Admitting: Neurology

## 2021-12-23 ENCOUNTER — Encounter: Payer: Self-pay | Admitting: Neurology

## 2021-12-23 VITALS — BP 112/75 | HR 67 | Ht 66.0 in | Wt 202.6 lb

## 2021-12-23 DIAGNOSIS — E66811 Obesity, class 1: Secondary | ICD-10-CM

## 2021-12-23 DIAGNOSIS — R351 Nocturia: Secondary | ICD-10-CM

## 2021-12-23 DIAGNOSIS — G4730 Sleep apnea, unspecified: Secondary | ICD-10-CM

## 2021-12-23 DIAGNOSIS — E669 Obesity, unspecified: Secondary | ICD-10-CM

## 2021-12-23 DIAGNOSIS — R519 Headache, unspecified: Secondary | ICD-10-CM

## 2021-12-23 DIAGNOSIS — R0681 Apnea, not elsewhere classified: Secondary | ICD-10-CM

## 2021-12-23 DIAGNOSIS — R0683 Snoring: Secondary | ICD-10-CM | POA: Diagnosis not present

## 2021-12-23 DIAGNOSIS — R002 Palpitations: Secondary | ICD-10-CM

## 2021-12-23 DIAGNOSIS — R0689 Other abnormalities of breathing: Secondary | ICD-10-CM

## 2021-12-23 NOTE — Patient Instructions (Signed)

## 2021-12-23 NOTE — Progress Notes (Signed)
Subjective:    Patient ID: April Fox is a 31 y.o. female.  HPI    Star Age, MD, PhD [facility] [address] [place], [place] [address]  Dear Erline Levine,  I saw your patient, April Fox, upon your kind request in my sleep clinic today for initial consultation of her sleep disorder, in particular, concern for underlying obstructive sleep apnea.  The patient is unaccompanied today.  As you know, Ms. Pownell is a 31 year old female with an underlying medical history of anemia, chest pain, palpitations, allergies, and mild obesity, who reports snoring and excessive daytime somnolence as well as morning headaches and witnessed apneas.  I reviewed your office note from 10/30/2021.  Her Epworth sleepiness score is 4 out of 24, fatigue severity score is 30 out of 63.  She has woken up with a sense of gasping for air and with palpitations, has seen cardiology, currently on a Zio patch.  She has no family history of sleep apnea.  She is familiar with sleep apnea as her mother-in-law has a CPAP machine.  Patient lives with her husband and 2 children, ages 55 and 24.  They have 2 dogs in the household, the dogs do not sleep in the bedroom at night.  They have no TV in the bedroom.  She does not drink caffeine daily.  She does not currently drink any alcohol, she is a non-smoker.  She works as a Doctor, hospital at [employer].  Bedtime is generally between 9 and 10 PM, rise time around 4 AM.  She has frequent morning headaches, she occasionally takes Tylenol or ibuprofen over-the-counter, sometimes the headache feels like a migraine.  She has nocturia about twice per average night.  Her Past Medical History Is Significant For: Past Medical History:  Diagnosis Date   Anemia    Hypertension of pregnancy, transient     Her Past Surgical History Is Significant For: Past Surgical History:  Procedure Laterality Date   NO PAST SURGERIES      Her Family History  Is Significant For: Family History  Problem Relation Age of Onset   CAD Neg Hx     Her Social History Is Significant For: Social History   Socioeconomic History   Marital status: Married    Spouse name: Not on file   Number of children: Not on file   Years of education: Not on file   Highest education level: Not on file  Occupational History   Not on file  Tobacco Use   Smoking status: Never   Smokeless tobacco: Never  Vaping Use   Vaping Use: Never used  Substance and Sexual Activity   Alcohol use: No   Drug use: No   Sexual activity: Yes  Other Topics Concern   Not on file  Social History Narrative   Lives with Husband   Right Handed   Social Determinants of Health   Financial Resource Strain: Not on file  Food Insecurity: Not on file  Transportation Needs: Not on file  Physical Activity: Not on file  Stress: Not on file  Social Connections: Not on file    Her Allergies Are:  No Known Allergies:   Her Current Medications Are:  Outpatient Encounter Medications as of 12/23/2021  Medication Sig   acetaminophen (TYLENOL) 325 MG tablet Take 2 tablets (650 mg total) by mouth every 4 (four) hours as needed (for pain scale < 4).   cetirizine (ZYRTEC) 10 MG tablet Take 10 mg  by mouth daily.   Cholecalciferol (VITAMIN D-3 PO) Take by mouth.   ibuprofen (ADVIL) 800 MG tablet Take 1 tablet (800 mg total) by mouth 3 (three) times daily.   levonorgestrel-ethinyl estradiol (NORDETTE) 0.15-30 MG-MCG tablet Take 1 tablet by mouth daily.   Prenatal Vit-Fe Fumarate-FA (PRENATAL MULTIVITAMIN) TABS tablet Take 1 tablet by mouth daily at 12 noon.   ferrous sulfate 325 (65 FE) MG tablet Take 1 tablet (325 mg total) by mouth every other day.   No facility-administered encounter medications on file as of 12/23/2021.  :   Review of Systems:  Out of a complete 14 point review of systems, all are reviewed and negative with the exception of these symptoms as listed  below:   Review of Systems  Neurological:        Pt is here for Sleep Consult. Pt states that she is tired all of the time. Pt states she is snoring at night. Pt says that she is unable to catch breath at night.Pt states that she has headaches.  ESS: 4 FSS:30    Objective:  Neurological Exam  Physical Exam Physical Examination:   Vitals:   12/23/21 0912  BP: 112/75  Pulse: 67    General Examination: The patient is a very pleasant 31 y.o. female in no acute distress. She appears well-developed and well-nourished and well groomed.   HEENT: Normocephalic, atraumatic, pupils are equal, round and reactive to light, extraocular tracking is good without limitation to gaze excursion or nystagmus noted. Hearing is grossly intact. Face is symmetric with normal facial animation. Speech is clear with no dysarthria noted. There is no hypophonia. There is no lip, neck/head, jaw or voice tremor. Neck is supple with full range of passive and active motion. There are no carotid bruits on auscultation. Oropharynx exam reveals: mild mouth dryness, adequate dental hygiene and mild airway crowding, due to smaller airway entry, tonsillar size of 1+, Mallampati class II.  Tongue protrudes centrally and palate elevates symmetrically, neck circumference 14-3/8 inches, no significant overbite.  Nasal inspection reveals no significant nasal mucosal swelling or inferior turbinate hypertrophy, slightly deviated septum to the right.    Chest: Clear to auscultation without wheezing, rhonchi or crackles noted.  Zio patch monitor in place.  Heart: S1+S2+0, regular and normal without murmurs, rubs or gallops noted.   Abdomen: Soft, non-tender and non-distended.  Extremities: There is no pitting edema in the distal lower extremities bilaterally.   Skin: Warm and dry without trophic changes noted.   Musculoskeletal: exam reveals no obvious joint deformities with the EXTR she noted mild swelling around left ankle,  she had a left ankle injury some 3 months ago.   Neurologically:  Mental status: The patient is awake, alert and oriented in all 4 spheres. Her immediate and remote memory, attention, language skills and fund of knowledge are appropriate. There is no evidence of aphasia, agnosia, apraxia or anomia. Speech is clear with normal prosody and enunciation. Thought process is linear. Mood is normal and affect is normal.  Cranial nerves II - XII are as described above under HEENT exam.  Motor exam: Normal bulk, strength and tone is noted. There is no obvious action or resting tremor.  Fine motor skills and coordination: grossly intact.  Cerebellar testing: No dysmetria or intention tremor. There is no truncal or gait ataxia.  Sensory exam: intact to light touch in the upper and lower extremities.  Gait, station and balance: She stands easily. No veering to one side is noted.  No leaning to one side is noted. Posture is age-appropriate and stance is narrow based. Gait shows normal stride length and normal pace. No problems turning are noted.   Assessment and plan:  In summary, Donita Linzer is a very pleasant 31 y.o.-year old female with an underlying medical history of anemia, chest pain, palpitations, allergies, and mild obesity, whose history and physical exam are concerning for sleep disordered breathing, supporting a current working diagnosis of unspecified sleep apnea, with the main differential diagnoses of obstructive sleep apnea (OSA) versus upper airway resistance syndrome (UARS) versus central sleep apnea (CSA), or mixed sleep apnea. A laboratory attended sleep study is considered gold standard for evaluation of sleep disordered breathing and is recommended at this time and clinically justified.   I had a long chat with the patient about my findings and the diagnosis of sleep apnea, particularly OSA, its prognosis and treatment options. We talked about medical/conservative treatments, surgical  interventions and non-pharmacological approaches for symptom control. I explained, in particular, the risks and ramifications of untreated moderate to severe OSA, especially with respect to developing cardiovascular disease down the road, including congestive heart failure (CHF), difficult to treat hypertension, cardiac arrhythmias (particularly A-fib), neurovascular complications including TIA, stroke and dementia. Even type 2 diabetes has, in part, been linked to untreated OSA. Symptoms of untreated OSA may include (but may not be limited to) daytime sleepiness, nocturia (i.e. frequent nighttime urination), memory problems, mood irritability and suboptimally controlled or worsening mood disorder such as depression and/or anxiety, lack of energy, lack of motivation, physical discomfort, as well as recurrent headaches, especially morning or nocturnal headaches. We talked about the the importance of maintaining a healthy lifestyle and striving for healthy weight.  In addition, we talked about the importance of striving for and maintaining good sleep hygiene. I recommended the following at this time: sleep study.  I outlined the differences between a laboratory attended sleep study which is considered more comprehensive and accurate over the option of a home sleep test (HST); the latter may lead to underestimation of sleep disordered breathing in some instances and does not help with diagnosing upper airway resistance syndrome and is not accurate enough to diagnose primary central sleep apnea typically. I explained the different sleep test procedures to the patient in detail and also outlined possible surgical and non-surgical treatment options of OSA, including the use of a pressure airway pressure (PAP) device (ie CPAP, AutoPAP/APAP or BiPAP in certain circumstances), a custom-made dental device (aka oral appliance, which would require a referral to a specialist dentist or orthodontist typically, and is generally  speaking not considered a good choice for patients with full dentures or edentulous state), upper airway surgical options, such as traditional UPPP (which is not considered a first-line treatment) or the Inspire device (hypoglossal nerve stimulator, which would involve a referral for consultation with an ENT surgeon, after careful selection, following inclusion criteria). I explained the PAP treatment option to the patient in detail, as this is generally considered first-line treatment.  The patient indicated that she would be willing to try PAP therapy, if the need arises. I explained the importance of being compliant with PAP treatment, not only for insurance purposes but primarily to improve patient's symptoms symptoms, and for the patient's long term health benefit, including to reduce Her cardiovascular risks longer-term.    We will pick up our discussion about the next steps and treatment options after testing.  We will keep her posted as to the test results by  phone call and/or MyChart messaging where possible.  We will plan to follow-up in sleep clinic accordingly as well.  I answered all her questions today and the patient was in agreement.   I encouraged her to call with any interim questions, concerns, problems or updates or email [place] through [place].  Generally speaking, sleep test authorizations may take up to 2 weeks, sometimes less, sometimes longer, the patient is encouraged to get in touch with [place] if they do not hear back from the sleep lab staff directly within the next 2 weeks.  Thank you very much for allowing me to participate in the care of this nice patient. If I can be of any further assistance to you please do not hesitate to call me at [phone].  Sincerely,   Star Age, MD, PhD

## 2022-01-01 ENCOUNTER — Telehealth: Payer: Self-pay | Admitting: Neurology

## 2022-01-01 NOTE — Telephone Encounter (Signed)
[facility] community pending uploaded notes  

## 2022-01-13 ENCOUNTER — Other Ambulatory Visit (HOSPITAL_COMMUNITY): Payer: Medicaid Other

## 2022-01-14 ENCOUNTER — Encounter (HOSPITAL_COMMUNITY): Payer: Self-pay | Admitting: Physician Assistant

## 2022-01-21 NOTE — Progress Notes (Deleted)
Office Visit    Patient Name: April Fox Date of Encounter: 01/21/2022  Primary Care Provider:  Diamantina Monks, NP Primary Cardiologist:  Armanda Magic, MD Primary Electrophysiologist: None  Chief Complaint    April Fox is a 31 y.o. female with PMH of transient hypertension during pregnancy, anemia who presents today for 6-week follow-up of palpitations and chest pain.  Past Medical History    Past Medical History:  Diagnosis Date   Anemia    Hypertension of pregnancy, transient    Past Surgical History:  Procedure Laterality Date   NO PAST SURGERIES      Allergies  No Known Allergies  History of Present Illness    April Fox  is a 31year old female with the above mention past medical history who presents today for follow-up of chest pain and palpitations.  April Fox was seen by Dr. Mayford Knife in 2021 via telehealth visit for complaint of chest pain.  She reported mild chest pain in her back and shoulders and CT was completed that was negative for PE.  EKG was abnormal with diffuse T wave abnormalities and troponins were negative x 2.  2D echo was completed with EF of 60 to 65%, no RWMA, normal RV systolic function, with normal valve function.  She was seen recently by Jari Favre, PA on 12/13/2021 with complaint of atypical chest pain and palpitations.  She underwent repeat 2D echo to rule out possible pericarditis that is pending completion.  She wore a 14-day event monitor that showed sinus rhythm with rare PACs and atrial couplets and negative for pauses or arrhythmias.  Since last being seen in the office patient reports***.  Patient denies chest pain, palpitations, dyspnea, PND, orthopnea, nausea, vomiting, dizziness, syncope, edema, weight gain, or early satiety.   ***Notes:  Home Medications    Current Outpatient Medications  Medication Sig Dispense Refill   acetaminophen (TYLENOL) 325 MG tablet Take 2 tablets (650 mg total) by mouth every 4 (four)  hours as needed (for pain scale < 4). 30 tablet 0   cetirizine (ZYRTEC) 10 MG tablet Take 10 mg by mouth daily.     Cholecalciferol (VITAMIN D-3 PO) Take by mouth.     ferrous sulfate 325 (65 FE) MG tablet Take 1 tablet (325 mg total) by mouth every other day. 15 tablet 0   ibuprofen (ADVIL) 800 MG tablet Take 1 tablet (800 mg total) by mouth 3 (three) times daily. 21 tablet 0   levonorgestrel-ethinyl estradiol (NORDETTE) 0.15-30 MG-MCG tablet Take 1 tablet by mouth daily.     Prenatal Vit-Fe Fumarate-FA (PRENATAL MULTIVITAMIN) TABS tablet Take 1 tablet by mouth daily at 12 noon.     No current facility-administered medications for this visit.     Review of Systems  Please see the history of present illness.    (+)*** (+)***  All other systems reviewed and are otherwise negative except as noted above.  Physical Exam    Wt Readings from Last 3 Encounters:  12/23/21 202 lb 9.6 oz (91.9 kg)  12/13/21 204 lb (92.5 kg)  10/29/21 200 lb (90.7 kg)   OE:VOJJK were no vitals filed for this visit.,There is no height or weight on file to calculate BMI.  Constitutional:      Appearance: Healthy appearance. Not in distress.  Neck:     Vascular: JVD normal.  Pulmonary:     Effort: Pulmonary effort is normal.     Breath sounds: No wheezing. No rales. Diminished in the bases  Cardiovascular:     Normal rate. Regular rhythm. Normal S1. Normal S2.      Murmurs: There is no murmur.  Edema:    Peripheral edema absent.  Abdominal:     Palpations: Abdomen is soft non tender. There is no hepatomegaly.  Skin:    General: Skin is warm and dry.  Neurological:     General: No focal deficit present.     Mental Status: Alert and oriented to person, place and time.     Cranial Nerves: Cranial nerves are intact.  EKG/LABS/Other Studies Reviewed    ECG personally reviewed by me today - ***  Risk Assessment/Calculations:   {Does this patient have ATRIAL FIBRILLATION?:[phone]}        Lab  Results  Component Value Date   WBC 7.9 12/13/2021   HGB 15.0 12/13/2021   HCT 44.3 12/13/2021   MCV 90 12/13/2021   PLT 317 12/13/2021   Lab Results  Component Value Date   CREATININE 0.72 12/13/2021   BUN 15 12/13/2021   NA 138 12/13/2021   K 4.3 12/13/2021   CL 103 12/13/2021   CO2 23 12/13/2021   Lab Results  Component Value Date   ALT 16 12/02/2018   AST 25 12/02/2018   ALKPHOS 103 12/02/2018   BILITOT 0.3 12/02/2018   No results found for: "CHOL", "HDL", "LDLCALC", "LDLDIRECT", "TRIG", "CHOLHDL"  No results found for: "HGBA1C"  Assessment & Plan    1.  Palpitations: -Patient had 14-day ZIO monitor completed that revealed predominant sinus rhythm with occasional PACs and PVCs. -Today patient reports***  2.  Atypical chest pain: -Patient reports today***  3.  Transient hypertension during pregnancy: -Patient's blood pressure today was***  4.***      Disposition: Follow-up with Armanda Magic, MD or APP in *** months {Are you ordering a CV Procedure (e.g. stress test, cath, DCCV, TEE, etc)?   Press F2        :[id number]}   Medication Adjustments/Labs and Tests Ordered: Current medicines are reviewed at length with the patient today.  Concerns regarding medicines are outlined above.   Signed, Napoleon Form, Leodis Rains, NP 01/21/2022, 6:19 PM [facility]  Note:  This document was prepared using Dragon voice recognition software and may include unintentional dictation errors.

## 2022-01-22 ENCOUNTER — Ambulatory Visit: Payer: Medicaid Other | Admitting: Nurse Practitioner

## 2022-01-22 DIAGNOSIS — R0789 Other chest pain: Secondary | ICD-10-CM

## 2022-02-05 NOTE — Telephone Encounter (Signed)
Rec'd auth for the NPSG ([id number] 12/6-12/31/23)  LVM for pt to call sleep lab back when ready to schedule

## 2022-02-11 NOTE — Telephone Encounter (Signed)
LVM for pt to call back to schedule sleep study.   We have attempted to call the patient two times to schedule sleep study.  Patient has been unavailable at the phone numbers we have on file and has not returned our calls.  If patient calls back we will schedule them for their sleep study.  

## 2022-03-03 ENCOUNTER — Ambulatory Visit: Payer: Medicaid Other | Admitting: Dietician

## 2022-03-13 NOTE — Telephone Encounter (Signed)
Patient left a voicemail on my phone about scheduling a SS.  The [place] has expired, I started a new case uploaded notes on the portal it is still pending.

## 2022-03-17 NOTE — Telephone Encounter (Signed)
Checked the status it is still pending.

## 2022-03-24 NOTE — Telephone Encounter (Signed)
Checked status it is still pending.

## 2022-03-31 NOTE — Telephone Encounter (Signed)
I spoke with the patient.  [place]G- [id number] (exp. 03/13/22 to 04/27/22)   She is scheduled at [facility] for 04/14/22 at 9 pm.  Mailed packet and sent mychart message about appointment information.

## 2022-03-31 NOTE — Telephone Encounter (Signed)
Updated [employer] community Josem KaufmannLY:[id number] (exp. 03/13/22 to 04/27/22)

## 2022-04-14 ENCOUNTER — Ambulatory Visit (INDEPENDENT_AMBULATORY_CARE_PROVIDER_SITE_OTHER): Payer: Medicaid Other | Admitting: Neurology

## 2022-04-14 DIAGNOSIS — E669 Obesity, unspecified: Secondary | ICD-10-CM

## 2022-04-14 DIAGNOSIS — R0681 Apnea, not elsewhere classified: Secondary | ICD-10-CM

## 2022-04-14 DIAGNOSIS — R0689 Other abnormalities of breathing: Secondary | ICD-10-CM

## 2022-04-14 DIAGNOSIS — R0683 Snoring: Secondary | ICD-10-CM

## 2022-04-14 DIAGNOSIS — R351 Nocturia: Secondary | ICD-10-CM

## 2022-04-14 DIAGNOSIS — G4720 Circadian rhythm sleep disorder, unspecified type: Secondary | ICD-10-CM

## 2022-04-14 DIAGNOSIS — R519 Headache, unspecified: Secondary | ICD-10-CM

## 2022-04-14 DIAGNOSIS — G4730 Sleep apnea, unspecified: Secondary | ICD-10-CM

## 2022-04-14 DIAGNOSIS — R002 Palpitations: Secondary | ICD-10-CM

## 2022-04-21 ENCOUNTER — Encounter: Payer: Self-pay | Admitting: Dietician

## 2022-04-21 ENCOUNTER — Encounter: Payer: Medicaid Other | Attending: Nurse Practitioner | Admitting: Dietician

## 2022-04-21 VITALS — Ht 66.0 in | Wt 197.0 lb

## 2022-04-21 DIAGNOSIS — R7303 Prediabetes: Secondary | ICD-10-CM | POA: Diagnosis not present

## 2022-04-21 DIAGNOSIS — Z713 Dietary counseling and surveillance: Secondary | ICD-10-CM | POA: Diagnosis not present

## 2022-04-21 DIAGNOSIS — E781 Pure hyperglyceridemia: Secondary | ICD-10-CM | POA: Insufficient documentation

## 2022-04-21 NOTE — Progress Notes (Signed)
Medical Nutrition Therapy  Appointment Start time:  57  Appointment End time:  72  Primary concerns today: Pt is concerned about what she should be eating to control her blood sugar and is worried about her prediabetes diagnosis.    Referral diagnosis: prediabetes Preferred learning style: no preference indicated Learning readiness: ready   NUTRITION ASSESSMENT   Anthropometrics  Ht: 66 in  Wt: 197 lbs  Clinical Medical Hx: anemia, HTN, prediabetes Medications: reviewed Labs: 12/02/21: A1c 5.8% Notable Signs/Symptoms: none reported Food Allergies: none  Lifestyle & Dietary Hx  Pt is present today with her mom.   Pt reports she was worried that she needed to cut all carbohydrates out of her diet.   Pt states she has bread with coffee and milk for breakfast daily. Pt works at [employer] and often gets food from [employer] court for lunch or [employer]. Pt is open to packing lunch.   Pt lives with husband and 2 kids. Pt cooks dinner for the family but pt states sometimes she skips dinner because she finds that she is not hungry, too tired, or sometimes nauseated.  Pt states she does not have time to exercise due to working full time and taking care of her kids. She goes on a walk once weekly. 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    Estimated daily fluid intake: 72 oz Supplements: iron, prenatal, vitamin d3.  Sleep: 6-7 hours Stress / self-care: moderate  Current average weekly physical activity: ADLs  24-Hr Dietary Recall First Meal: 8am: bread and coffee with milk  Snack: none Second Meal: fast food: hot dog OR pizza OR subway Snack: none Third Meal: skips OR protein, bread, and vegetables Snack: none Beverages: coffee with milk, water, occasional sada   NUTRITION DIAGNOSIS  NB-1.1 Food and nutrition-related knowledge deficit As related to prediabetes.  As evidenced by A1c  5.8%.   NUTRITION INTERVENTION  Nutrition education (E-1) on the following topics:  Impact of stress on blood glucose MyPlate Building balanced meals and snacks Importance of consistent meal times and avoiding skipping meals Prediabetes Insulin resistance Benefits of physical activity on blood glucose Impact of adequate hydration on blood glucose  Handouts Provided Include  My Placemat for Diabetes Snack Sheet Meal Ideas  Learning Style & Readiness for Change Teaching method utilized: Visual & Auditory  Demonstrated degree of understanding via: Teach Back  Barriers to learning/adherence to lifestyle change: none  Goals Established by Pt  Aim for 150 minutes of physical activity weekly. Goal: continue your walk 1x/week. Aim to add 1-2 more times for exercise (considering the gym).   Goal: aim to include a vegetable with lunch and dinner.   Aim to eat within 1-2 hours of waking up and every 3-5 hours following. Avoid skipping meals.    MONITORING & EVALUATION Dietary intake, weekly physical activity, and follow up in 5 months.  Next Steps  Patient is to call for questions.

## 2022-04-21 NOTE — Patient Instructions (Addendum)
Aim for 150 minutes of physical activity weekly. Goal: continue your walk 1x/week. Aim to add 1-2 more times for exercise (considering the gym).   Goal: aim to include a vegetable with lunch and dinner.   Aim to eat within 1-2 hours of waking up and every 3-5 hours following. Avoid skipping meals.

## 2022-04-22 NOTE — Procedures (Signed)
Physician Interpretation:     [facility] POLYSOMNOGRAPHY  INTERPRETATION REPORT   STUDY DATE:  04/14/2022     PATIENT NAME:  April Fox         DATE OF BIRTH:  15-Feb-1990  PATIENT ID:  [id number]    TYPE OF STUDY:  PSG  READING PHYSICIAN: Star Age, MD, PhD   SCORING TECHNICIAN: Gaylyn Cheers, RPSGT  Referred by: Oval Linsey, NP   History and Indication for Testing: 32 year old female with an underlying medical history of anemia, chest pain, palpitations, allergies, and mild obesity, who reports snoring and excessive daytime somnolence as well as morning headaches and witnessed apneas. Her Epworth sleepiness score is 4 out of 24, fatigue severity score is 30 out of 63. Height: 66 in Weight: 202 lb (BMI 32) Neck Size: 14 in   MEDICATIONS: Tylenol, Zyrtec, Vitamin D3, Advil, Nordette, Prenatal Vit-Fe Fumarate, Iron  TECHNICAL DESCRIPTION: A registered sleep technologist was in attendance for the duration of the recording.  Data collection, scoring, video monitoring, and reporting were performed in compliance with the AASM Manual for the Scoring of Sleep and Associated Events; (Hypopnea is scored based on the criteria listed in Section VIII D. 1b in the AASM Manual V2.6 using a 4% oxygen desaturation rule or Hypopnea is scored based on the criteria listed in Section VIII D. 1a in the AASM Manual V2.6 using 3% oxygen desaturation and /or arousal rule).   SLEEP CONTINUITY AND SLEEP ARCHITECTURE:  Lights-out was at 22:00: and lights-on at  05:26:, with a total recording time of 7 hours, 26 min. Total sleep time ( TST) was 424.0 minutes with a high sleep efficiency at 95.1%. There was  12.6% REM sleep.   BODY POSITION:  TST was divided  between the following sleep positions: 64.0% supine;  36.0% lateral;  0% prone. Duration of total sleep and percent of total sleep in their respective position is as follows: supine 271 minutes (64%), non-supine 153  minutes (36%); right 101 minutes (24%), left 51 minutes (12%), and prone 00 minutes (0%).  Total supine REM sleep time was 43 minutes (81% of total REM sleep).  Sleep latency was normal at 15.5 minutes.  REM sleep latency was mildly increased at 159.5 minutes. Of the total sleep time, the percentage of stage N1 sleep was 1.1%, stage N2 sleep was 62%, which is increased, stage N3 sleep was 24.4%, which is mildly increased, and REM sleep was 12.6%, which is reduced. Wake after sleep onset (WASO) time accounted for 6.5 minutes with minimal sleep fragmentation noted.   RESPIRATORY MONITORING:   Based on CMS criteria (using a 4% oxygen desaturation rule for scoring hypopneas), there were 1 apneas (0 obstructive; 1 central; 0 mixed), and 3 hypopneas.  Apnea index was 0.1. Hypopnea index was 0.4. The apnea-hypopnea index was 0.6 overall (0.2 supine, 0 non-supine; 1.1 REM, 1.4 supine REM).  There were 0 respiratory effort-related arousals (RERAs).  The RERA index was 0 events/h. Total respiratory disturbance index (RDI) was 0.6 events/h. RDI results showed: supine RDI  0.2 /h; non-supine RDI 1.2 /h; REM RDI 1.1 /h, supine REM RDI 1.4 /h.   Based on AASM criteria (using a 3% oxygen desaturation and /or arousal rule for scoring hypopneas), there were 1 apneas (0 obstructive; 1 central; 0 mixed), and 14 hypopneas. Apnea index was 0.1. Hypopnea index was 2.0. The apnea-hypopnea index was 2.1/hour overall (2.2 supine, 0 non-supine; 2.2 REM, 2.8 supine REM).  There were 0 respiratory  effort-related arousals (RERAs).  The RERA index was 0 events/h. Total respiratory disturbance index (RDI) was 2.1 events/h. RDI results showed: supine RDI  2.2 /h; non-supine RDI 2.0 /h; REM RDI 2.2 /h, supine REM RDI 2.8 /h.   OXIMETRY: Oxyhemoglobin Saturation Nadir during sleep was at  90% from a mean of 96%.  Of the Total sleep time (TST)   hypoxemia (=<88%) was present for  0.0 minutes, or 0.0% of total sleep time.   LIMB  MOVEMENTS: There were 0 periodic limb movements of sleep (0.0/hr), of which 0 (0.0/hr) were associated with an arousal.  AROUSAL: There were 21 arousals in total, for an arousal index of 3 arousals/hour.  Of these, 2 were identified as respiratory-related arousals (0 /h), 0 were PLM-related arousals (0 /h), and 20 were non-specific arousals (3 /h).  EEG: Review of the EEG showed no abnormal electrical discharges and symmetrical bihemispheric findings.    EKG: The EKG revealed normal sinus rhythm (NSR). The average heart rate during sleep was 60 bpm.   AUDIO/VIDEO REVIEW: The audio and video review did not show any abnormal or unusual behaviors, movements, phonations or vocalizations. The patient took no restroom breaks. Snoring was intermittent, mostly mild, at times moderate.  POST-STUDY QUESTIONNAIRE: Post study, the patient indicated, that sleep was the same as usual.   IMPRESSION:  1. Primary Snoring 2. Dysfunctions associated with sleep stages or arousal from sleep  RECOMMENDATIONS:  1. This study does not demonstrate any significant obstructive or central sleep disordered breathing with an AHI of less than 5/hour - Her total AHI was 2.1/hour - and oxygen saturations remained at or above 90% for the night. Mild to moderate intermittent snoring was noted. Treatment with a positive airway pressure device, such as CPAP or autoPAP is not indicated. Weight loss and avoiding the supine sleep position may aid in reducing her snoring.  2. This study minimal sleep fragmentation and mildly abnormal sleep stage percentages; these are nonspecific findings and per se do not signify an intrinsic sleep disorder or a cause for the patient's sleep-related symptoms. Causes include (but are not limited to) the first night effect of the sleep study, circadian rhythm disturbances, medication effect or an underlying mood disorder or medical problem.  3. The patient should be cautioned not to drive, work at  heights, or operate dangerous or heavy equipment when tired or sleepy. Review and reiteration of good sleep hygiene measures should be pursued with any patient. 4. The patient will be advised to follow up with the referring provider, who will be notified of the test results.   I certify that I have reviewed the entire raw data recording prior to the issuance of this report in accordance with the Standards of Accreditation of the American Academy of Sleep Medicine (AASM).  Star Age, MD, PhD Medical Director, [facility]) [place], [place] (Neurology and Sleep)               Technical Report:   General Information  Name: Neal, Romanchuk BMI: 32.60 Physician: Star Age, MD  ID: [id number] Height: 66.0 in Technician: Gaylyn Cheers, RPSGT  Sex: Female Weight: 202.0 lb Record: [id number]  Age: 44 [1990-05-16] Date: 04/14/2022    Medical & Medication History    Ms. Feast is a 32 year old female with an underlying medical history of anemia, chest pain, palpitations, allergies, and mild obesity, who reports snoring and excessive daytime somnolence as well as morning headaches and witnessed apneas. I reviewed your office  note from 10/30/2021. Her Epworth sleepiness score is 4 out of 24, fatigue severity score is 30 out of 63. She has woken up with a sense of gasping for air and with palpitations, has seen cardiology, currently on a Zio patch. Ms. Marcello is a 32 year old female with an underlying medical history of anemia, chest pain, palpitations, allergies, and mild obesity, who reports snoring and excessive daytime somnolence as well as morning headaches and witnessed apneas. I reviewed your office note from 10/30/2021. Her Epworth sleepiness score is 4 out of 24, fatigue severity score is 30 out of 63. She has woken up with a sense of gasping for air and with palpitations, has seen cardiology, currently on a Zio patch.  Tylenol, Zyrtec, Vitamin  D3, Advil, Nordette, Prenatal Vit-Fe Fumarate, Iron   Sleep Disorder      Comments   Patient arrived for a diagnostic polysomnogram. Procedure explained and all questions answered. Standard paste setup without complications. Patient slept supine and right. Mild to moderate snoring was heard. No significant respiratory events observed. No obvious cardiac arrhythmias noted. No significant PLMS observed. Patient had no restroom visit.    Lights out: 10:00:00 PM Lights on: 05:26:12 AM   Time Total Supine Side Prone Upright  Recording (TRT) 7h 26.29m 4h 44.49m 2h 41.23m 0h 0.99m 0h 0.65m  Sleep (TST) 7h 4.61m 4h 31.70m 2h 32.85m 0h 0.77m 0h 0.8m   Latency N1 N2 N3 REM Onset Per. Slp. Eff.  Actual 0h 0.32m 0h 2.108m 0h 28.77m 2h 39.24m 0h 15.19m 0h 15.27m 95.07%   Stg Dur Wake N1 N2 N3 REM  Total 22.0 4.5 262.5 103.5 53.5  Supine 13.0 2.0 190.0 36.0 43.5  Side 9.0 2.5 72.5 67.5 10.0  Prone 0.0 0.0 0.0 0.0 0.0  Upright 0.0 0.0 0.0 0.0 0.0   Stg % Wake N1 N2 N3 REM  Total 4.9 1.1 61.9 24.4 12.6  Supine 2.9 0.5 44.8 8.5 10.3  Side 2.0 0.6 17.1 15.9 2.4  Prone 0.0 0.0 0.0 0.0 0.0  Upright 0.0 0.0 0.0 0.0 0.0     Apnea Summary Sub Supine Side Prone Upright  Total 1 Total 1 0 1 0 0    REM 0 0 0 0 0    NREM 1 0 1 0 0  Obs 0 REM 0 0 0 0 0    NREM 0 0 0 0 0  Mix 0 REM 0 0 0 0 0    NREM 0 0 0 0 0  Cen 1 REM 0 0 0 0 0    NREM 1 0 1 0 0   Rera Summary Sub Supine Side Prone Upright  Total 0 Total 0 0 0 0 0    REM 0 0 0 0 0    NREM 0 0 0 0 0   Hypopnea Summary Sub Supine Side Prone Upright  Total 14 Total 14 10 4  0 0    REM 2 2 0 0 0    NREM 12 8 4  0 0   4% Hypopnea Summary Sub Supine Side Prone Upright  Total (4%) 3 Total 3 1 2  0 0    REM 1 1 0 0 0    NREM 2 0 2 0 0     AHI Total Obs Mix Cen  2.12 Apnea 0.14 0.00 0.00 0.14   Hypopnea 1.98 -- -- --  0.57 Hypopnea (4%) 0.42 -- -- --    Total Supine Side Prone Upright  Position AHI 2.12 2.21 1.97  0.00 0.00  REM AHI 2.24   NREM AHI 2.11    Position RDI 2.12 2.21 1.97 0.00 0.00  REM RDI 2.24   NREM RDI 2.11    4% Hypopnea Total Supine Side Prone Upright  Position AHI (4%) 0.57 0.22 1.18 0.00 0.00  REM AHI (4%) 1.12   NREM AHI (4%) 0.49   Position RDI (4%) 0.57 0.22 1.18 0.00 0.00  REM RDI (4%) 1.12   NREM RDI (4%) 0.49    Desaturation Information Threshold: 2% <100% <90% <80% <70% <60% <50% <40%  Supine 74.0 0.0 0.0 0.0 0.0 0.0 0.0  Side 29.0 0.0 0.0 0.0 0.0 0.0 0.0  Prone 0.0 0.0 0.0 0.0 0.0 0.0 0.0  Upright 0.0 0.0 0.0 0.0 0.0 0.0 0.0  Total 103.0 0.0 0.0 0.0 0.0 0.0 0.0  Index 14.4 0.0 0.0 0.0 0.0 0.0 0.0   Threshold: 3% <100% <90% <80% <70% <60% <50% <40%  Supine 14.0 0.0 0.0 0.0 0.0 0.0 0.0  Side 9.0 0.0 0.0 0.0 0.0 0.0 0.0  Prone 0.0 0.0 0.0 0.0 0.0 0.0 0.0  Upright 0.0 0.0 0.0 0.0 0.0 0.0 0.0  Total 23.0 0.0 0.0 0.0 0.0 0.0 0.0  Index 3.2 0.0 0.0 0.0 0.0 0.0 0.0   Threshold: 4% <100% <90% <80% <70% <60% <50% <40%  Supine 3.0 0.0 0.0 0.0 0.0 0.0 0.0  Side 2.0 0.0 0.0 0.0 0.0 0.0 0.0  Prone 0.0 0.0 0.0 0.0 0.0 0.0 0.0  Upright 0.0 0.0 0.0 0.0 0.0 0.0 0.0  Total 5.0 0.0 0.0 0.0 0.0 0.0 0.0  Index 0.7 0.0 0.0 0.0 0.0 0.0 0.0   Threshold: 3% <100% <90% <80% <70% <60% <50% <40%  Supine 14 0 0 0 0 0 0  Side 9 0 0 0 0 0 0  Prone 0 0 0 0 0 0 0  Upright 0 0 0 0 0 0 0  Total 23 0 0 0 0 0 0   Awakening/Arousal Information # of Awakenings 8  Wake after sleep onset 6.37m  Wake after persistent sleep 6.12m   Arousal Assoc. Arousals Index  Apneas 0 0.0  Hypopneas 2 0.3  Leg Movements 7 1.0  Snore 0 0.0  PTT Arousals 0 0.0  Spontaneous 20 2.8  Total 28 4.0  Leg Movement Information PLMS LMs Index  Total LMs during PLMS 0 0.0  LMs w/ Microarousals 0 0.0   LM LMs Index  w/ Microarousal 7 1.0  w/ Awakening 5 0.7  w/ Resp Event 0 0.0  Spontaneous 22 3.1  Total 29 4.1     Desaturation threshold setting: 3% Minimum desaturation setting: 10 seconds SaO2 nadir: 90% The longest event was a 28 sec  obstructive Hypopnea with a minimum SaO2 of 94%. The lowest SaO2 was 92% associated with a 13 sec obstructive Hypopnea. EKG Rates EKG Avg Max Min  Awake 66 93 56  Asleep 60 92 47  EKG Events: N/A

## 2022-04-24 ENCOUNTER — Telehealth: Payer: Self-pay | Admitting: *Deleted

## 2022-04-24 NOTE — Telephone Encounter (Signed)
-----   Message from Star Age, MD sent at 04/22/2022 12:03 PM EDT ----- Patient referred by PCP, seen by me on 12/23/21, diagnostic PSG on 04/14/22.   Please call and notify the patient that the recent sleep study did not show any significant obstructive sleep apnea. She had intermittent, mild to moderate snoring. Treatment with a CPAP or similar machine is not indicated. Her snoring may improve with weight loss and avoiding the back sleep position. At this juncture, she can follow up with her PCP and other providers.    Thanks,  Star Age, MD, PhD [facility])

## 2022-04-24 NOTE — Telephone Encounter (Signed)
Spoke with patient and discussed his sleep study results as noted below by Dr. Rexene Alberts.  Patient verbalized understanding.  Her questions were answered.  She was advised to follow-up with her primary care and other providers.  She verbalized appreciation for the call.  Sleep study report sent to referring provider.

## 2022-09-16 ENCOUNTER — Ambulatory Visit: Payer: Medicaid Other | Admitting: Dietician

## 2022-11-27 ENCOUNTER — Encounter: Payer: Self-pay | Admitting: Dietician

## 2022-11-27 ENCOUNTER — Encounter: Payer: Medicaid Other | Attending: Nurse Practitioner | Admitting: Dietician

## 2022-11-27 VITALS — Wt 185.0 lb

## 2022-11-27 DIAGNOSIS — R7303 Prediabetes: Secondary | ICD-10-CM | POA: Diagnosis present

## 2022-11-27 NOTE — Patient Instructions (Signed)
New Goal: do some strength training 2 times per week for 30 minutes.   Previous Goals: Goal: continue your walk 1x/week. Aim to add 1-2 more times for exercise (considering the gym). - goal met, continue.   Goal: aim to include a vegetable with lunch and dinner.   Aim to eat within 1-2 hours of waking up and every 3-5 hours following. Avoid skipping meals.

## 2022-11-27 NOTE — Progress Notes (Signed)
Medical Nutrition Therapy  Appointment Start time:  27  Appointment End time:  1745  Primary concerns today: Pt is concerned about what she should be eating to control her blood sugar and is worried about her prediabetes diagnosis.    Referral diagnosis: prediabetes Preferred learning style: no preference indicated Learning readiness: ready   NUTRITION ASSESSMENT   Anthropometrics  Ht: 66 in  Wt 04/21/22: 197 lbs Wt 11/27/22: 185 lbs  Clinical Medical Hx: anemia, HTN, prediabetes Medications: reviewed Labs: 12/02/21: A1c 5.8% Notable Signs/Symptoms: none reported Food Allergies: none  Lifestyle & Dietary Hx  Pt with 12 lb weight loss since previous visit (7 months). Pt states she wants to continue to lose some weight.   Pt states she has been eating more vegetables, along with that she cut down on sugar and sweets, and is now packing her lunch for work. Pt used to eat at the [employer] court for lunch and now packs leftovers which usually consists of a protein, starch, and lots of vegetables.   Pt previously walking 1 time per week, pt now walking 2-3 days per week for 60 minutes.   Pt states she spent 1 month in [place] this summer and felt like it threw her off track a little because she was offered lots of sweets but pt states she declined or she would only eat a little of it.   Estimated daily fluid intake: 72-84 oz Supplements: iron, prenatal, vitamin d3. (Takes occasionally, not consistent) Sleep: 7 hours Stress / self-care: moderate  Current average weekly physical activity: walking 2-3 times weekly for 60 minutes.   24-Hr Dietary Recall First Meal: 8am: coffee with milk  Snack: none Second Meal: meat and vegetables (leftovers) Snack: none Third Meal:  protein, starch, and vegetables Snack: none Beverages: coffee with milk, 5-6 bottles water, occasional homemade juice   NUTRITION DIAGNOSIS  NB-1.1 Food and nutrition-related knowledge deficit As related to  prediabetes.  As evidenced by A1c 5.8%.   NUTRITION INTERVENTION  Nutrition education (E-1) on the following topics:  Assessed pt's progress with eating habits and exercise routine- pt has met food and exercise goals and plans to continue. Discussed the benefits of resistance training and strength training on bone health, muscle strength, metabolism, etc. Pt plans to start implementing 2 days a week of resistance training. Discussed Plate Method with pt. At meals, aim to include 1/2 plate non-starchy vegetables, 1/4 plate protein, and 1/4 plate complex carbs.    Handouts Provided Include (initial assessment) My Placemat for Diabetes Snack Sheet Meal Ideas  Learning Style & Readiness for Change Teaching method utilized: Visual & Auditory  Demonstrated degree of understanding via: Teach Back  Barriers to learning/adherence to lifestyle change: none  Goals Established by Pt  New Goal: do some strength training 2 times per week for 30 minutes.   Previous Goals: Goal: continue your walk 1x/week. Aim to add 1-2 more times for exercise (considering the gym). - goal met, continue with 2-3 days per week of walking!!  Goal: aim to include a vegetable with lunch and dinner. - goal met, continue!!  Aim to eat within 1-2 hours of waking up and every 3-5 hours following. Avoid skipping meals. - goal in progress, continue.    MONITORING & EVALUATION Dietary intake, weekly physical activity, and follow up in 3 months.  Next Steps  Patient is to call for questions.

## 2023-01-28 NOTE — L&D Delivery Note (Signed)
 OB/GYN Faculty Practice Delivery Note  April Fox is a 33 y.o. [id number] s/p SVD at [redacted]w[redacted]d. She was admitted for SOL.   ROM: 1h 21m with moderate meconium fluid GBS Status: negative  Labor Progress: Patient admitted through the MAU dilated to 6cm. She got an epidural and then AROM with moderate meconium. She rapidly progressed to 10/+2 cm.   Delivery Date/Time: 11/23/23 at 0330 Delivery: Called to room and patient was complete and pushing. Fox delivered straight occiput anterior. nuchal cord absent. There was a 50 second shoulder dystocia with McRobert's and suprapubic pressure applied. Shoulder and body delivered in usual fashion. Infant with spontaneous cry, placed on mother's abdomen, dried and stimulated. Cord clamped x 2 after 1-minute delay, and cut by father. Cord blood drawn. Placenta delivered spontaneously with gentle cord traction. Fundus firm with massage and Pitocin . Labia, perineum, vagina, and cervix inspected inspected with first degree--repaired with 3-0 vicryl and periuretheral laceration--not repaired.   Placenta:  spontaneous Intact Complications:none Lacerations: 1st degree and periurethral EBL: Analgesia: Epidural  Newborn Data: Birth date:11/23/2024 Birth time:3:30 AM Gender:Female Living status:Living Apgars:9 ,9  Weight: Pending           Leeroy Pouch, MD [facility] Fellow, [facility] for [facility], [facility] 11/23/2023, 3:49 AM

## 2023-02-25 ENCOUNTER — Ambulatory Visit: Payer: Medicaid Other | Admitting: Dietician

## 2023-04-27 LAB — OB RESULTS CONSOLE RPR: RPR: NONREACTIVE

## 2023-04-27 LAB — OB RESULTS CONSOLE GC/CHLAMYDIA
Chlamydia: NEGATIVE
Neisseria Gonorrhea: NEGATIVE

## 2023-04-27 LAB — OB RESULTS CONSOLE HIV ANTIBODY (ROUTINE TESTING): HIV: NONREACTIVE

## 2023-04-27 LAB — OB RESULTS CONSOLE HEPATITIS B SURFACE ANTIGEN: Hepatitis B Surface Ag: NEGATIVE

## 2023-04-27 LAB — OB RESULTS CONSOLE ANTIBODY SCREEN: Antibody Screen: NEGATIVE

## 2023-04-27 LAB — HEPATITIS C ANTIBODY: HCV Ab: NEGATIVE

## 2023-04-27 LAB — OB RESULTS CONSOLE RUBELLA ANTIBODY, IGM: Rubella: IMMUNE

## 2023-06-16 ENCOUNTER — Other Ambulatory Visit: Payer: Self-pay | Admitting: Obstetrics & Gynecology

## 2023-06-16 DIAGNOSIS — Z363 Encounter for antenatal screening for malformations: Secondary | ICD-10-CM

## 2023-07-22 ENCOUNTER — Ambulatory Visit: Payer: Self-pay | Attending: Obstetrics & Gynecology

## 2023-07-22 ENCOUNTER — Ambulatory Visit: Payer: Self-pay | Admitting: Obstetrics and Gynecology

## 2023-07-22 VITALS — BP 121/67 | HR 98

## 2023-07-22 DIAGNOSIS — O132 Gestational [pregnancy-induced] hypertension without significant proteinuria, second trimester: Secondary | ICD-10-CM | POA: Insufficient documentation

## 2023-07-22 DIAGNOSIS — Z3A23 23 weeks gestation of pregnancy: Secondary | ICD-10-CM | POA: Diagnosis present

## 2023-07-22 DIAGNOSIS — Z363 Encounter for antenatal screening for malformations: Secondary | ICD-10-CM | POA: Insufficient documentation

## 2023-07-22 NOTE — Progress Notes (Signed)
 After review, MFM consult with provider is not indicated for today  Arna Ranks, MD 07/22/2023 9:14 AM  [facility]

## 2023-09-26 ENCOUNTER — Encounter (HOSPITAL_COMMUNITY): Payer: Self-pay | Admitting: Obstetrics & Gynecology

## 2023-09-26 ENCOUNTER — Other Ambulatory Visit: Payer: Self-pay

## 2023-09-26 ENCOUNTER — Inpatient Hospital Stay (HOSPITAL_COMMUNITY)
Admission: AD | Admit: 2023-09-26 | Discharge: 2023-09-26 | Disposition: A | Discharge status: Home / Self Care | Attending: Obstetrics & Gynecology | Admitting: Obstetrics & Gynecology

## 2023-09-26 DIAGNOSIS — O26893 Other specified pregnancy related conditions, third trimester: Secondary | ICD-10-CM | POA: Diagnosis present

## 2023-09-26 DIAGNOSIS — M545 Low back pain, unspecified: Secondary | ICD-10-CM

## 2023-09-26 DIAGNOSIS — Z3A32 32 weeks gestation of pregnancy: Secondary | ICD-10-CM | POA: Insufficient documentation

## 2023-09-26 DIAGNOSIS — O368130 Decreased fetal movements, third trimester, not applicable or unspecified: Secondary | ICD-10-CM | POA: Insufficient documentation

## 2023-09-26 LAB — URINALYSIS, ROUTINE W REFLEX MICROSCOPIC
Bilirubin Urine: NEGATIVE
Glucose, UA: NEGATIVE mg/dL
Hgb urine dipstick: NEGATIVE
Ketones, ur: NEGATIVE mg/dL
Leukocytes,Ua: NEGATIVE
Nitrite: NEGATIVE
Protein, ur: NEGATIVE mg/dL
Specific Gravity, Urine: 1.01 (ref 1.005–1.030)
pH: 5 (ref 5.0–8.0)

## 2023-09-26 LAB — WET PREP, GENITAL
Clue Cells Wet Prep HPF POC: NONE SEEN
Sperm: NONE SEEN
Trich, Wet Prep: NONE SEEN
WBC, Wet Prep HPF POC: >=10 — AB (ref <10)
Yeast Wet Prep HPF POC: NONE SEEN

## 2023-09-26 IMAGING — CT CT L SPINE W/O CM
3 of 4 series · 12 of 33 positions shown, 14 images · non-contrast
Comparison: Lumbar spine radiographs 02/17/2018.

CLINICAL DATA: Fall.  Trauma low back.  Question sacral fracture.

EXAM:
CT LUMBAR SPINE WITHOUT CONTRAST
TECHNIQUE: Multidetector CT imaging of the lumbar spine was performed without
intravenous contrast administration. Multiplanar CT image
reconstructions were also generated.

[Series 4: l spine st · axial · 0.35mm/px · z∈[-350,-150]mm · 4 of 151 slices shown, 5 images]
[im 26/151  soft-tissue]
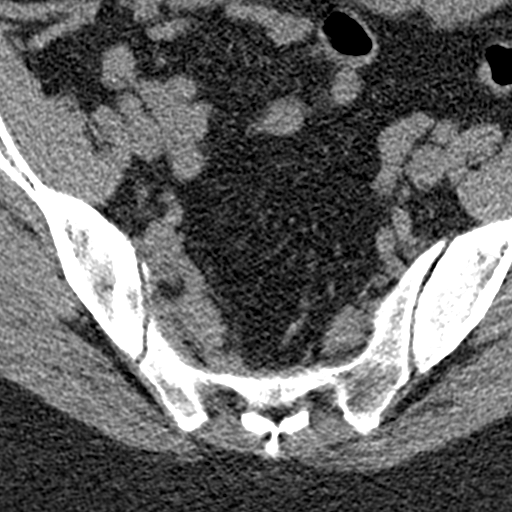
[im 26/151  bone]
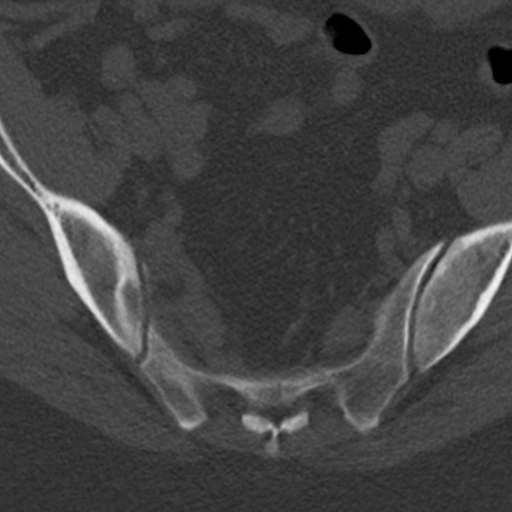
[im 51/151  bone]
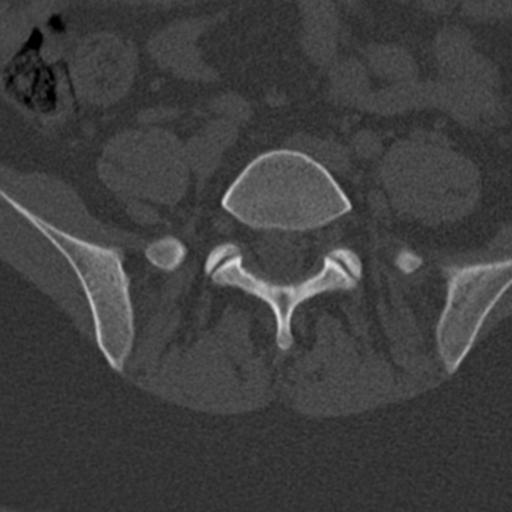
[im 101/151  bone]
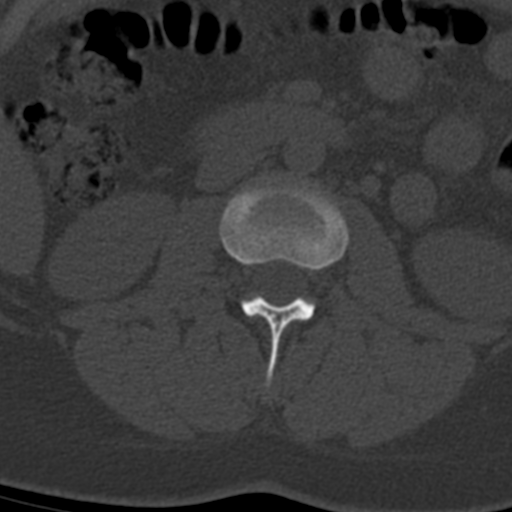
[im 126/151  bone]
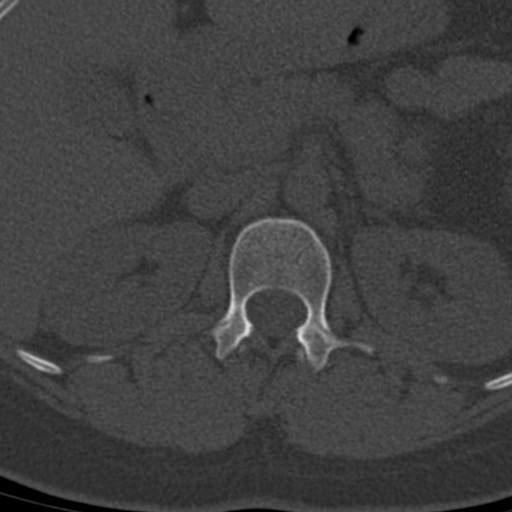

[Series 8: coronal bone · coronal · 0.34mm/px · 3 of 103 slices shown]
[im 21/103  bone]
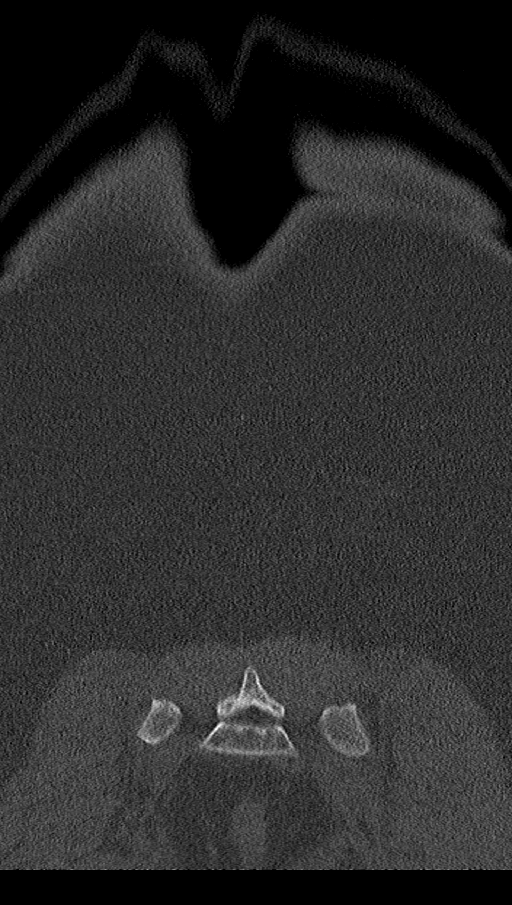
[im 41/103  bone]
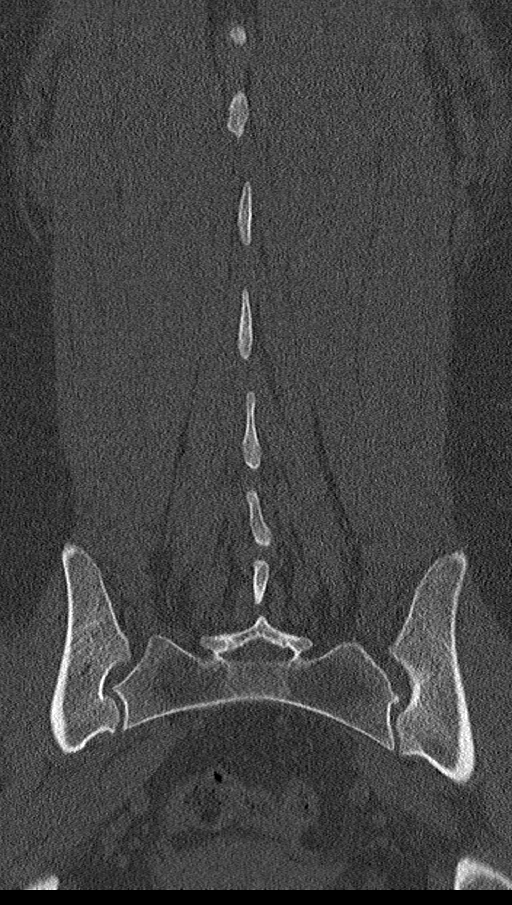
[im 62/103  bone]
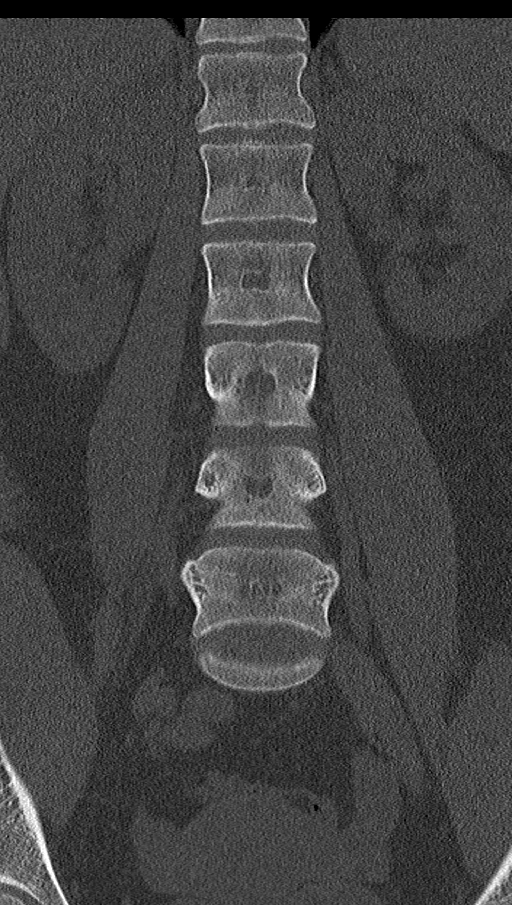

[Series 10: sagittal st · sagittal · 0.40mm/px · 5 of 89 slices shown, 6 images]
[im 30/89  bone]
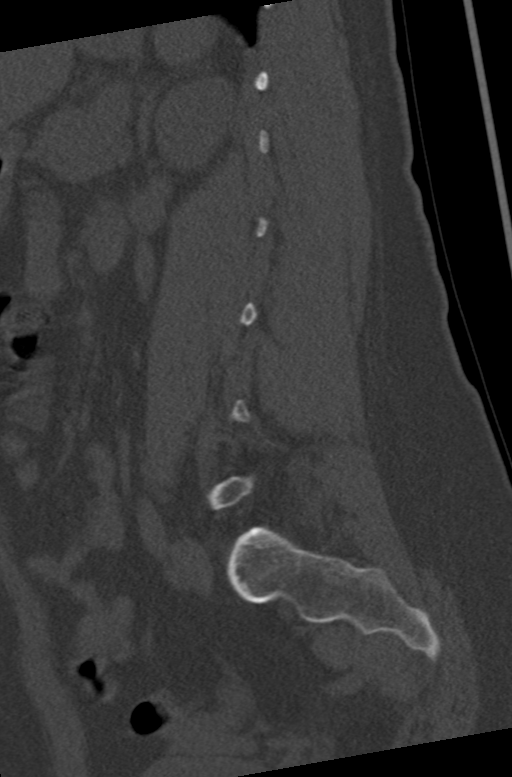
[im 37/89  bone]
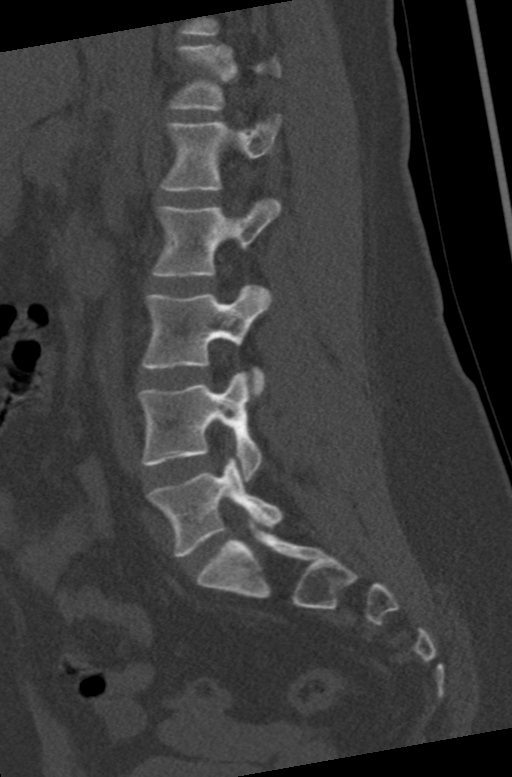
[im 45/89  soft-tissue]
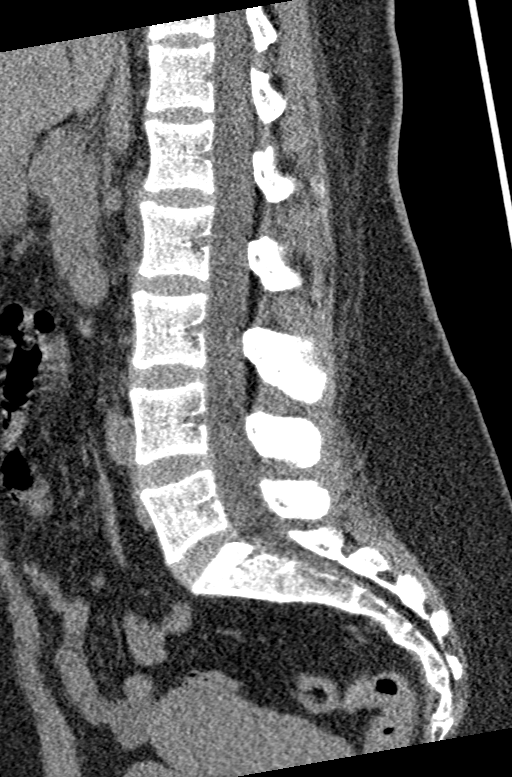
[im 45/89  bone]
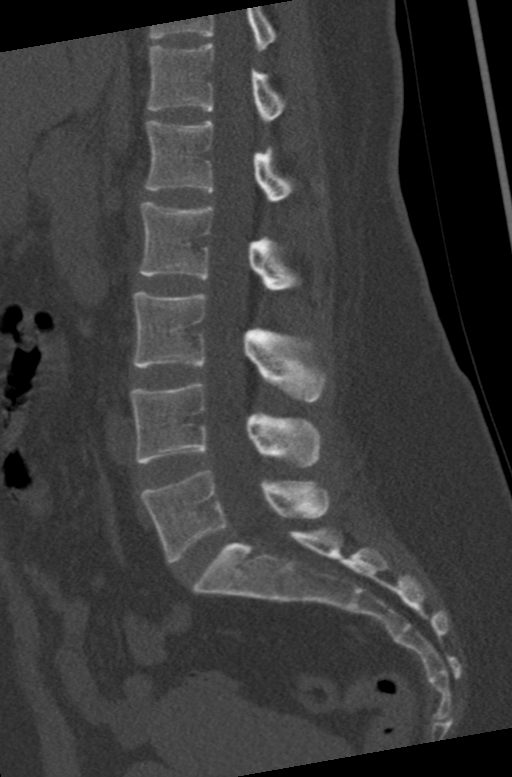
[im 52/89  bone]
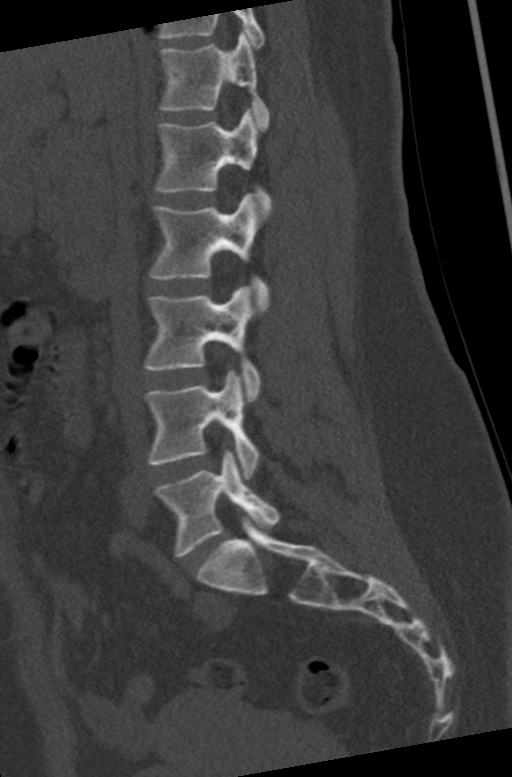
[im 59/89  bone]
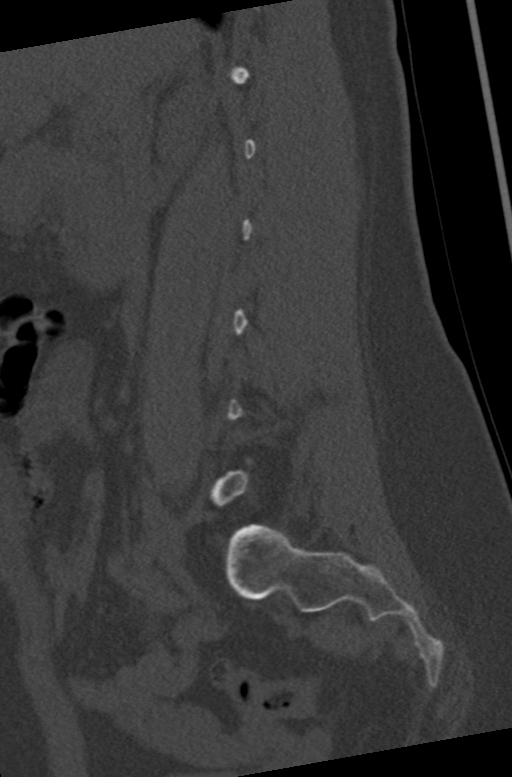

[12 of 33 positions shown; findings below may reference images not displayed]

FINDINGS: Segmentation: 5 non rib-bearing lumbar type vertebral bodies are
present. The lowest fully formed vertebral body is L5.

Alignment: No significant listhesis is present. Straightening of the
normal lumbar lordosis is stable.

Vertebrae: Vertebral body heights are normal. No acute or healing
fractures are present. Sacrum is intact. No focal osseous lesions
are present.

Paraspinal and other soft tissues: Limited imaging the abdomen is
unremarkable. There is no significant adenopathy. No solid organ
lesions are present.

Disc levels: No significant focal disc disease or stenosis is
present.
IMPRESSION: 1. No acute or healing fractures.
2. Stable straightening of the normal lumbar lordosis. This may be
positional or related to muscle spasm.

## 2023-09-26 MED ORDER — CYCLOBENZAPRINE HCL 5 MG PO TABS
10.0000 mg | ORAL_TABLET | Freq: Once | ORAL | Status: DC
  Filled 2023-09-26: qty 2

## 2023-09-26 MED ORDER — METHOCARBAMOL 500 MG PO TABS
1000.0000 mg | ORAL_TABLET | Freq: Once | ORAL | Status: AC
  Administered 2023-09-26: 1000 mg via ORAL
  Filled 2023-09-26: qty 2

## 2023-09-26 MED ORDER — METHOCARBAMOL 500 MG PO TABS: 500.0000 mg | ORAL_TABLET | Freq: Three times a day (TID) | ORAL | 1 refills | Status: AC | PRN

## 2023-09-26 MED ORDER — ACETAMINOPHEN 500 MG PO TABS
1000.0000 mg | ORAL_TABLET | Freq: Once | ORAL | Status: AC
  Administered 2023-09-26: 1000 mg via ORAL
  Filled 2023-09-26: qty 2

## 2023-09-26 NOTE — Discharge Instructions (Signed)
 BACK PAIN: Rest Use ice/heat/warm bath Increase PO fluids, aim for 80-100 ounces of fluid intake each day Tylenol  is safe to take for pain. You can take [phone] mg every 6 hours. Do not take more than 4000 mg total in 24 hours. Pregnancy support belt Pregnancy Yoga: There are free options through YouTube and you can purchase DVDs.  Talk with your OB about a referral for formal physical therapy. Float therapy: This involves soaking in a warm bath of magnesium to help relax muscles. Local places that have this option are [facility] in [place] or [facility] in [facility] Massage therapy: This is safe in pregnancy. Just assure your therapist is trained in prenatal massage. Some local options include [employer] and [facility] Chiropractic care: This involves re-aligning the bones and muscles of the body. You want to make sure the practitioner has training in pregnancy (Webster Method).  A local option is [facility] at [employer] and [employer]. [phone] [email or web address] and there are many other chiropractic offices that do adjustments in pregnancy  BACK PAIN EXERCISES Stomach tone  Lie on your front with your arms by your side, head on one side. Pull in your stomach muscles, centered around your belly button. Hold for five seconds. Repeat three times. Build up to 10 seconds and repeat during the day, while walking or standing. Keep breathing during this exercise!  Pelvic tilt  Lie down with your knees bent. Tighten your stomach muscles, flattening your back against the floor. Hold for five seconds. Repeat five times.  Knee rolls  Lie on your back with your knees bent and your feet together. Roll your knees to one side, keeping your shoulders flat on the bed or floor, and hold for 10 seconds. Roll your knees back to the starting position, and then over to the other side and repeat. Do this exercise three times  on each side.  Knees to chest  Lie on your back, with your knees bent and feet flat on the floor or bed. Bring one knee up and use your hands to pull it gently towards your chest. Hold the leg in position for five seconds, and then relax. Repeat this exercise with the other knee. Do the exercise five times on each side.  Buttock tone  Lie on your front and bend one leg up behind you. Lift your bent knee just off the floor. Hold for up to eight seconds. Repeat five times each side.   Deep stomach muscle tone  Kneel on all fours with a small curve in your lower back. Let your stomach relax completely. Pull the lower part of your stomach upwards so that you lift your back (without arching it) away from the floor. Hold for 10 seconds. Keep breathing! Repeat 10 times.  Back stabilizer  Kneel on all fours with your back straight. Tighten your stomach. Keeping your back in this position, raise one arm in front of you and hold for 10 seconds. Try to keep your pelvis level and don't rotate your body. Repeat 10 times each side. To progress, try lifting one leg behind you instead of raising your arm.  Leg raise  Lie face down, though you might want to turn your head to one side if this is more comfortable. Tighten your stomach and buttock muscles to lift one leg slightly off the floor, while keeping your hips flat on the ground. Hold this position for 5 to 10 seconds and repeat 3  times.   Arm raise  Lie on your stomach with your back in a neutral position. Tense the muscles in your lower stomach and raise one arm upwards. Hold this position for five seconds, and then relax your arm. Repeat this exercise 10 times with each arm.  Hamstring stretch  Steady yourself, then put one leg up on a chair. Keeping your raised leg straight, bend the supporting knee forward to stretch your hamstrings. Repeat three times each side. Please note: For those with acute sciatica this hamstring stretch may also pull  on the sciatic nerve, making it feel worse. If in doubt, ask a physiotherapist if this exercise is suitable for you.  One-leg stand  Steady yourself with one hand on a wall or work surface for support. Bend one leg up behind you. Hold your foot for 10 seconds and repeat three times each side. Try to keep your knees and thighs level with one another.  Deep lunge  Kneel on one knee, the other foot in front. Lift your back knee up, making sure you keep looking forwards. Push your hips forward. Hold for five seconds and repeat three times each side. Try to keep your upper body upright, avoid bending or leaning your upper body forwards.

## 2023-09-26 NOTE — MAU Note (Signed)
 April Fox is a 32 y.o. at [redacted]w[redacted]d here in MAU reporting: lower back pain that started 1 week ago that is getting worse. Pain in lower abdomen that feels like CTXs that started last night that comes and goes. Vaginal bleeding that started last night that has stopped now. States she had to wear a pad last night. Reports less fetal movement than normal. Denies any LOF. Increase in pelvic pressure.    Onset of complaint: last night  Pain score: 7 Vitals:   09/26/23 1808  BP: 120/76  Pulse: 100  Resp: 16  SpO2: 100%     QYU:izqzmmzi, pt wearing a dress  Lab orders placed from triage:

## 2023-09-26 NOTE — MAU Provider Note (Addendum)
 Chief Complaint:  Back Pain, Contractions, Pelvic Pain, and Decreased Fetal Movement   HPI    April Fox is a 33 y.o. G3P2002 at [redacted]w[redacted]d who presents to maternity admissions reporting that she has been having lower back pain which has progressively been getting worse.  Patient also reports she is having lower pelvic pain, contractions, and reports she has had some decreased fetal movements.  She denies any vaginal bleeding or leaking of fluid  Her prenatal care is received at [facility] department and the available prenatal records have been reviewed.  Patient recently seen a [facility] department with similar complaints.  They advised belly band, pregnancy support pillow, and had prescribed Flexeril  for lower back pain relief.  Pregnancy Course: [facility]  Past Medical History:  Diagnosis Date   Anemia    Hypertension of pregnancy, transient    OB History  Gravida Para Term Preterm AB Living  3 2 2   2   SAB IAB Ectopic Multiple Live Births     0 2    # Outcome Date GA Lbr Len/2nd Weight Sex Type Anes PTL Lv  3 Current           2 Term 12/02/18 [redacted]w[redacted]d 11:00 / 00:41 3435 g M Vag-Spont EPI  LIV  1 Term 06/27/16 [redacted]w[redacted]d 09:19 / 00:29 3220 g F Vag-Spont EPI  LIV   Past Surgical History:  Procedure Laterality Date   NO PAST SURGERIES     Family History  Problem Relation Age of Onset   CAD Neg Hx    Social History   Tobacco Use   Smoking status: Never   Smokeless tobacco: Never  Vaping Use   Vaping status: Never Used  Substance Use Topics   Alcohol use: No   Drug use: No   No Known Allergies Medications Prior to Admission  Medication Sig Dispense Refill Last Dose/Taking   acetaminophen  (TYLENOL ) 325 MG tablet Take 2 tablets (650 mg total) by mouth every 4 (four) hours as needed (for pain scale < 4). 30 tablet 0 09/26/2023 at  3:30 PM   cetirizine (ZYRTEC) 10 MG tablet Take 10 mg by mouth daily. (Patient not taking: Reported on 07/22/2023)       Cholecalciferol (VITAMIN D-3 PO) Take by mouth.      ferrous sulfate  325 (65 FE) MG tablet Take 1 tablet (325 mg total) by mouth every other day. (Patient not taking: Reported on 07/22/2023) 15 tablet 0    ibuprofen  (ADVIL ) 800 MG tablet Take 1 tablet (800 mg total) by mouth 3 (three) times daily. (Patient not taking: Reported on 07/22/2023) 21 tablet 0    levonorgestrel-ethinyl estradiol (NORDETTE) 0.15-30 MG-MCG tablet Take 1 tablet by mouth daily. (Patient not taking: Reported on 07/22/2023)      Prenatal Vit-Fe Fumarate-FA (PRENATAL MULTIVITAMIN) TABS tablet Take 1 tablet by mouth daily at 12 noon.       I have reviewed patient's Past Medical Hx, Surgical Hx, Family Hx, Social Hx, medications and allergies.   ROS  Pertinent items noted in HPI and remainder of comprehensive ROS otherwise negative.   PHYSICAL EXAM  Patient Vitals for the past 24 hrs:  BP Temp src Pulse Resp SpO2 Height Weight  09/26/23 1831 123/76 -- (!) 112 -- 100 % -- --  09/26/23 1808 120/76 Oral 100 16 100 % 5' 6 (1.676 m) 97.2 kg    Constitutional: Well-developed, well-nourished obese female in no acute distress.  Cardiovascular: normal rate & rhythm, warm and well-perfused  Respiratory: normal effort, no problems with respiration noted GI: Abd soft, non-tender, gravid MS: Extremities nontender, no edema, normal ROM Neurologic: Alert and oriented x 4.  GU: no CVA tenderness Pelvic: Chaperoned by Griselda Punter RN Speculum: No pooling, no evidence of blood in the vaginal vault, cervix visually closed   Dilation: Closed Effacement (%): Thick Exam by:: Olam Dalton, NP  Fetal Tracing: Cat 1 reactive at 1951 Baseline:130  Variability: moderate Accelerations: present Decelerations:absent Toco: UI    Labs: Results for orders placed or performed during the hospital encounter of 09/26/23 (from the past 24 hours)  Wet prep, genital     Status: Abnormal   Collection Time: 09/26/23  7:11 PM  Result Value Ref  Range   Yeast Wet Prep HPF POC NONE SEEN NONE SEEN   Trich, Wet Prep NONE SEEN NONE SEEN   Clue Cells Wet Prep HPF POC NONE SEEN NONE SEEN   WBC, Wet Prep HPF POC >=10 (A) <10   Sperm NONE SEEN   Urinalysis, Routine w reflex microscopic -Urine, Random     Status: None   Collection Time: 09/26/23  7:11 PM  Result Value Ref Range   Color, Urine YELLOW YELLOW   APPearance CLEAR CLEAR   Specific Gravity, Urine 1.010 1.005 - 1.030   pH 5.0 5.0 - 8.0   Glucose, UA NEGATIVE NEGATIVE mg/dL   Hgb urine dipstick NEGATIVE NEGATIVE   Bilirubin Urine NEGATIVE NEGATIVE   Ketones, ur NEGATIVE NEGATIVE mg/dL   Protein, ur NEGATIVE NEGATIVE mg/dL   Nitrite NEGATIVE NEGATIVE   Leukocytes,Ua NEGATIVE NEGATIVE    Imaging:  No results found.  MDM & MAU COURSE  MDM:  HIGH  [facility] Physical exam performed with pelvic Vaginal cultures obtained UA NST for gestational age and fetal reassurance/DFM Robaxin /Tylenol  ordered for lower back pain since patient already failed Flexeril  Sterile vaginal exam: Long/closed/high  Suspect musculoskeletal pain in pregnancy with lower back pain likely physiological No evidence of preterm labor with benign exam and closed cervix   MAU Course: Orders Placed This Encounter  Procedures   Wet prep, genital   Urinalysis, Routine w reflex microscopic -Urine, Random   Meds ordered this encounter  Medications   DISCONTD: cyclobenzaprine  (FLEXERIL ) tablet 10 mg   acetaminophen  (TYLENOL ) tablet 1,000 mg   methocarbamol  (ROBAXIN ) tablet 1,000 mg    I have reviewed the patient chart and performed the physical exam . I have ordered & interpreted the lab results and reviewed and interpreted the NST   Patient care signed out to oncoming provider Joesph Sear ,PA @ 2000   ASSESSMENT  1. Lumbar spine pain (Primary)  2. [redacted] weeks gestation of pregnancy    PLAN  Discharge home in stable condition with  preterm labor return precautions. Prescription for Robaxin  sent to pharmacy. Encouraged to do prenatal yoga and supportive care. Talk with OB about PT referral.  Joesph DELENA Sear, PA

## 2023-09-29 LAB — GC/CHLAMYDIA PROBE AMP (~~LOC~~) NOT AT ARMC
Chlamydia: NEGATIVE
Comment: NEGATIVE
Comment: NORMAL
Neisseria Gonorrhea: NEGATIVE

## 2023-10-01 ENCOUNTER — Other Ambulatory Visit: Payer: Self-pay

## 2023-10-01 ENCOUNTER — Inpatient Hospital Stay (HOSPITAL_COMMUNITY)
Admission: AD | Admit: 2023-10-01 | Discharge: 2023-10-01 | Disposition: A | Discharge status: Home / Self Care | Payer: Self-pay | Attending: Obstetrics & Gynecology | Admitting: Obstetrics & Gynecology

## 2023-10-01 ENCOUNTER — Inpatient Hospital Stay (HOSPITAL_BASED_OUTPATIENT_CLINIC_OR_DEPARTMENT_OTHER)

## 2023-10-01 ENCOUNTER — Encounter (HOSPITAL_COMMUNITY): Payer: Self-pay | Admitting: Obstetrics & Gynecology

## 2023-10-01 DIAGNOSIS — O289 Unspecified abnormal findings on antenatal screening of mother: Secondary | ICD-10-CM

## 2023-10-01 DIAGNOSIS — Z3689 Encounter for other specified antenatal screening: Secondary | ICD-10-CM

## 2023-10-01 DIAGNOSIS — O368330 Maternal care for abnormalities of the fetal heart rate or rhythm, third trimester, not applicable or unspecified: Secondary | ICD-10-CM

## 2023-10-01 DIAGNOSIS — O99013 Anemia complicating pregnancy, third trimester: Secondary | ICD-10-CM | POA: Insufficient documentation

## 2023-10-01 DIAGNOSIS — Z3A33 33 weeks gestation of pregnancy: Secondary | ICD-10-CM | POA: Diagnosis not present

## 2023-10-01 DIAGNOSIS — O26843 Uterine size-date discrepancy, third trimester: Secondary | ICD-10-CM

## 2023-10-01 LAB — URINALYSIS, ROUTINE W REFLEX MICROSCOPIC
Bilirubin Urine: NEGATIVE
Glucose, UA: 50 mg/dL — AB
Hgb urine dipstick: NEGATIVE
Ketones, ur: NEGATIVE mg/dL
Nitrite: NEGATIVE
Protein, ur: NEGATIVE mg/dL
Specific Gravity, Urine: 1.009 (ref 1.005–1.030)
pH: 6 (ref 5.0–8.0)

## 2023-10-01 NOTE — MAU Provider Note (Signed)
 Chief Complaint:  Elevated Fetal Heart Rate (Patient states her fetal heart rate was running 180's for 15 minutes and [facility]. She states they measured her belly at 36 and she is 33 weeks. She states that she has [facility] for an [facility]. )   HPI   None     April Fox is a 33 y.o. G3P2002 at [redacted]w[redacted]d who presents to maternity admissions for monitoring after NST at the health department showed fetal tachycardia. [facility] had consistent fetal heart rate in the 180s. Denies vaginal bleeding, leaking of fluid. Endorses good fetal movement.  Pregnancy Course: Receives care at [facility] Department. Prenatal records reviewed. Pregnancy complicated by anemia, low pack pain, abnormal glucose.  Past Medical History:  Diagnosis Date   Anemia    Hypertension of pregnancy, transient    OB History  Gravida Para Term Preterm AB Living  3 2 2   2   SAB IAB Ectopic Multiple Live Births     0 2    # Outcome Date GA Lbr Len/2nd Weight Sex Type Anes PTL Lv  3 Current           2 Term 12/02/18 [redacted]w[redacted]d 11:00 / 00:41 3435 g M Vag-Spont EPI  LIV  1 Term 06/27/16 [redacted]w[redacted]d 09:19 / 00:29 3220 g F Vag-Spont EPI  LIV   Past Surgical History:  Procedure Laterality Date   NO PAST SURGERIES     Family History  Problem Relation Age of Onset   CAD Neg Hx    Social History   Tobacco Use   Smoking status: Never   Smokeless tobacco: Never  Vaping Use   Vaping status: Never Used  Substance Use Topics   Alcohol use: No   Drug use: No   No Known Allergies Medications Prior to Admission  Medication Sig Dispense Refill Last Dose/Taking   acetaminophen  (TYLENOL ) 325 MG tablet Take 2 tablets (650 mg total) by mouth every 4 (four) hours as needed (for pain scale < 4). 30 tablet 0 09/30/2023 at  9:00 PM   methocarbamol  (ROBAXIN ) 500 MG tablet Take 1-2 tablets (500-1,000 mg total) by mouth every 8 (eight) hours as needed (back pain). 60 tablet 1  09/30/2023 at  9:00 PM   Prenatal Vit-Fe Fumarate-FA (PRENATAL MULTIVITAMIN) TABS tablet Take 1 tablet by mouth daily at 12 noon.   09/30/2023 Morning   Cholecalciferol (VITAMIN D-3 PO) Take by mouth.   More than a month    I have reviewed patient's Past Medical Hx, Surgical Hx, Family Hx, Social Hx, medications and allergies.   ROS  Pertinent items noted in HPI and remainder of comprehensive ROS otherwise negative.   PHYSICAL EXAM  Patient Vitals for the past 24 hrs:  BP Temp Temp src Pulse Resp SpO2 Height Weight  10/01/23 1215 -- -- -- -- -- 100 % -- --  10/01/23 1145 -- -- -- -- -- 99 % -- --  10/01/23 1055 125/74 98.6 F (37 C) Oral (!) 118 17 100 % 5' 6 (1.676 m) 98.9 kg  10/01/23 1054 -- -- -- -- -- 100 % -- --    Constitutional: Well-developed, well-nourished female in no acute distress.  HEENT: atraumatic, normocephalic. Neck has normal ROM. EOM intact. Cardiovascular: normal rate & rhythm, warm and well-perfused Respiratory: normal effort, no problems with respiration noted GI: Abd soft, non-tender, non-distended MSK: Extremities nontender, no edema, normal ROM Skin: warm and dry. Acyanotic, no jaundice or  pallor. Neurologic: Alert and oriented x 4. No abnormal coordination. Psychiatric: Normal mood. Speech not slurred, not rapid/pressured. Patient is cooperative. GU: no CVA tenderness Pelvic exam: deferred  Fetal Tracing: Baseline FHR: 145 per minute Fetal heart variability: moderate Fetal Heart Rate accelerations: yes Fetal Heart Rate decelerations: two variable decels to 120 Fetal Non-stress Test: Category II (indeterminate) Toco: no uterine contractions   Labs: Results for orders placed or performed during the hospital encounter of 10/01/23 (from the past 24 hours)  Urinalysis, Routine w reflex microscopic -Urine, Clean Catch     Status: Abnormal   Collection Time: 10/01/23 11:27 AM  Result Value Ref Range   Color, Urine YELLOW YELLOW   APPearance CLEAR CLEAR    Specific Gravity, Urine 1.009 1.005 - 1.030   pH 6.0 5.0 - 8.0   Glucose, UA 50 (A) NEGATIVE mg/dL   Hgb urine dipstick NEGATIVE NEGATIVE   Bilirubin Urine NEGATIVE NEGATIVE   Ketones, ur NEGATIVE NEGATIVE mg/dL   Protein, ur NEGATIVE NEGATIVE mg/dL   Nitrite NEGATIVE NEGATIVE   Leukocytes,Ua TRACE (A) NEGATIVE   RBC / HPF 0-5 0 - 5 RBC/hpf   WBC, UA 6-10 0 - 5 WBC/hpf   Bacteria, UA RARE (A) NONE SEEN   Squamous Epithelial / HPF 0-5 0 - 5 /HPF   Mucus PRESENT     Imaging:  US  MFM FETAL BPP WO NON STRESS Result Date: 10/01/2023 ----------------------------------------------------------------------  OBSTETRICS REPORT                       (Signed Final 10/01/2023 03:16 pm) ---------------------------------------------------------------------- Patient Info  ID #:       [id number]                          D.O.B.:  August 11, 1990 (33 yrs)(F)  Name:       April Fox                  Visit Date: 10/01/2023 02:05 pm ---------------------------------------------------------------------- Performed By  Attending:        Steffan Keys MD         Ref. Address:     [facility]  Performed By:     Burnard LITTIE Custard          Secondary Phy.:   [facility] MAU/Triage                    RDMS  Referred By:      Joesph Sear PA      [facility] ---------------------------------------------------------------------- Orders  #  Description                           Code        Ordered By  1  US  MFM FETAL BPP WO NON               23180.98    Vander Kueker     STRESS ----------------------------------------------------------------------  #  Order #  Accession #                Episode #  1  [id number]                   [id number]                 [id number] ---------------------------------------------------------------------- Indications  Non-reactive NST                               O28.9  [redacted] weeks gestation of pregnancy                 Z3A.33 ---------------------------------------------------------------------- Fetal Evaluation  Num Of Fetuses:         1  Fetal Heart Rate(bpm):  146  Cardiac Activity:       Observed  Presentation:           Cephalic  Placenta:               Anterior  Amniotic Fluid  AFI FV:      Within normal limits  AFI Sum(cm)     %Tile       Largest Pocket(cm)  17.6            65          6.8  RUQ(cm)       RLQ(cm)       LUQ(cm)        LLQ(cm)  6.8           3.9           3              3.9 ---------------------------------------------------------------------- Biophysical Evaluation  Amniotic F.V:   Within normal limits       F. Tone:        Observed  F. Movement:    Observed                   Score:          6/8  F. Breathing:   Non-sustained ---------------------------------------------------------------------- OB History  Gravidity:    3         Term:   2  Living:       2 ---------------------------------------------------------------------- Gestational Age  LMP:           33w 2d        Date:  02/10/23                  EDD:   11/17/23  Best:          33w 2d     Det. By:  LMP  (02/10/23)          EDD:   11/17/23 ---------------------------------------------------------------------- Anatomy  Stomach:               Appears normal, left   Bladder:                Appears normal                         sided  Kidneys:               Appear normal ---------------------------------------------------------------------- Comments  This [facility]  due to fetal tachycardia.  She had a reactive NST in the MAU  without any signs of fetal tachycardia, although 2  mild  variable decelerations were noted.  A biophysical profile performed today was 6/8.  The fetus  received a -2 for nonsustained fetal breathing movements.  The AFI was 17.6 cm.  The fetus was in the vertex presentation.  Based on today's testing, the fetal status is reassuring.  ----------------------------------------------------------------------                  Steffan Keys, MD Electronically Signed Final Report   10/01/2023 03:16 pm ----------------------------------------------------------------------    MDM & MAU COURSE  MDM: Moderate  MAU Course: -Vital signs within normal limits. -Category II NST due to variable decels, will get BPP. -BPP 6/8, points off for breathing. -Overall status reassuring, patient has US  scheduled for tomorrow already. -Discussed with Dr. Keys who read BPP, with BPP and reactive NST fetal status reassuring. Can discharge and follow up with US  as planned tomorrow.  Orders Placed This Encounter  Procedures   Culture, OB Urine   US  MFM FETAL BPP WO NON STRESS   Urinalysis, Routine w reflex microscopic -Urine, Clean Catch   Discharge patient   No orders of the defined types were placed in this encounter.   ASSESSMENT   1. Category II fetal heart rate tracing during maternal care in third trimester   2. NST (non-stress test) reactive   3. [redacted] weeks gestation of pregnancy     PLAN  Discharge home in stable condition with return precautions.  Follow up with US  as scheduled tomorrow.    Follow-up Information     Department, [facility] Follow up.   Why: As scheduled for ongoing prenatal care Contact information: [address] [place] [address] [phone]                  Allergies as of 10/01/2023   No Known Allergies      Medication List     TAKE these medications    acetaminophen  325 MG tablet Commonly known as: Tylenol  Take 2 tablets (650 mg total) by mouth every 4 (four) hours as needed (for pain scale < 4).   methocarbamol  500 MG tablet Commonly known as: ROBAXIN  Take 1-2 tablets (500-1,000 mg total) by mouth every 8 (eight) hours as needed (back pain).   prenatal multivitamin Tabs tablet Take 1 tablet by mouth daily at 12 noon.   VITAMIN D-3 PO Take by mouth.         Joesph DELENA Sear, PA

## 2023-10-01 NOTE — Discharge Instructions (Addendum)
 Please go to your ultrasound appointment tomorrow so they can do further evaluation!  Reasons to return to MAU at [facility]: Less than 36 weeks: Contractions feels like menstrual cramps. You should go to the hospital if you have more than 6 contractions in an hour, even after you have rested and drank at least 16 ounces of water.  More than 36 weeks: You begin to have strong, frequent contractions 5 minutes apart or less, each last 1 minute, these have been going on for 1-2 hours, and you cannot walk or talk during them. Your water breaks.  Sometimes it is a big gush of fluid. However, many times it may it may be much more subtle. You should go to the hospital if you have a constant leakage of fluid from your vagina, enough to soak a pad when you are walking around.  You have vaginal bleeding.  It is normal to have a small amount of spotting if your cervix was checked. If you have bleeding requiring the use of a pad, go to the hospital. You don't feel your baby moving like normal.  If you think that you baby's movement is decreased, eat a snack and rest on your left side in a quiet room for one hour. If you have not felt the baby move more than 6 times in an hour GO TO THE HOSPITAL.

## 2023-10-02 ENCOUNTER — Ambulatory Visit

## 2023-10-02 ENCOUNTER — Encounter: Payer: Self-pay | Admitting: Maternal & Fetal Medicine

## 2023-10-02 ENCOUNTER — Ambulatory Visit: Attending: Obstetrics and Gynecology | Admitting: Maternal & Fetal Medicine

## 2023-10-02 ENCOUNTER — Other Ambulatory Visit: Payer: Self-pay | Admitting: *Deleted

## 2023-10-02 VITALS — BP 123/65 | HR 97

## 2023-10-02 DIAGNOSIS — Z3A33 33 weeks gestation of pregnancy: Secondary | ICD-10-CM

## 2023-10-02 DIAGNOSIS — O26843 Uterine size-date discrepancy, third trimester: Secondary | ICD-10-CM

## 2023-10-02 DIAGNOSIS — Z3483 Encounter for supervision of other normal pregnancy, third trimester: Secondary | ICD-10-CM | POA: Insufficient documentation

## 2023-10-02 NOTE — Progress Notes (Signed)
   Patient information  Patient Name: April Fox  Patient MRN:   [id number]  Referring practice: MFM Referring Provider: [place] [facility])  Problem List   Patient Active Problem List   Diagnosis Date Noted   Uterine size-date discrepancy in third trimester 10/02/2023   Supervision of normal intrauterine pregnancy in multigravida in third trimester 10/02/2023   [redacted] weeks gestation of pregnancy 10/02/2023   Maternal Fetal medicine Consult  April Fox is a 33 y.o. G3P2002 at [redacted]w[redacted]d here for ultrasound and consultation. April Fox is doing well today with no acute concerns. Today we focused on the following:   The patient was sent here due to size versus date discrepancy on fundal height.  I discussed that the fetal ultrasound shows the estimated fetal weight at the 83rd percentile overall.  I discussed the importance of assessing her diet and monitoring for excessive carbohydrates and sugars as well as maintaining an active lifestyle to limit the fetal growth.  We briefly discussed the complications of excessive fetal growth during pregnancy.  She will follow-up in 4 weeks for an ultrasound.  The patient had time to ask questions that were answered to her satisfaction.  She verbalized understanding and agrees to proceed with the plan below.  Sonographic findings Single intrauterine pregnancy. Fetal cardiac activity: Observed. Presentation: Cephalic. Interval fetal anatomy appears normal. Fetal biometry shows the estimated fetal weight at the 85 percentile. Amniotic fluid: Within normal limits.  MVP: 6.55 cm. Placenta: Anterior.  There are limitations of prenatal ultrasound such as the inability to detect certain abnormalities due to poor visualization. Various factors such as fetal position, gestational age and maternal body habitus may increase the difficulty in visualizing the fetal anatomy.    Recommendations - Due to difficulty assessing fundal height  I recommend a follow-up growth ultrasound in 4 weeks. - If the estimated fetal weight remains in the normal range there is no need to alter the delivery timing.  Review of Systems: A review of systems was performed and was negative except per HPI   Vitals and Physical Exam    10/02/2023    8:25 AM 10/01/2023   10:55 AM 09/26/2023    9:06 PM  Vitals with BMI  Height  5' 6   Weight  218 lbs   BMI  35.2   Systolic 123 125 884  Diastolic 65 74 64  Pulse 97 118 82    Sitting comfortably on the sonogram table Nonlabored breathing Normal rate and rhythm Abdomen is nontender  Past pregnancies OB History  Gravida Para Term Preterm AB Living  3 2 2   2   SAB IAB Ectopic Multiple Live Births     0 2    # Outcome Date GA Lbr Len/2nd Weight Sex Type Anes PTL Lv  3 Current           2 Term 12/02/18 [redacted]w[redacted]d 11:00 / 00:41 7 lb 9.2 oz (3.435 kg) M Vag-Spont EPI  LIV  1 Term 06/27/16 [redacted]w[redacted]d 09:19 / 00:29 7 lb 1.6 oz (3.22 kg) F Vag-Spont EPI  LIV     I spent 30 minutes reviewing the patients chart, including labs and images as well as counseling the patient about her medical conditions. Greater than 50% of the time was spent in direct face-to-face patient counseling.  Delora Smaller  MFM, [facility]   10/02/2023  9:26 AM

## 2023-10-04 ENCOUNTER — Ambulatory Visit: Payer: Self-pay | Admitting: Family Medicine

## 2023-10-04 DIAGNOSIS — O2313 Infections of bladder in pregnancy, third trimester: Secondary | ICD-10-CM

## 2023-10-04 LAB — CULTURE, OB URINE: Culture: 90000 — AB

## 2023-10-04 MED ORDER — NITROFURANTOIN MONOHYD MACRO 100 MG PO CAPS
100.0000 mg | ORAL_CAPSULE | Freq: Two times a day (BID) | ORAL | 0 refills | Status: AC
Start: 2023-10-04 — End: 2023-10-11

## 2023-10-13 ENCOUNTER — Encounter (HOSPITAL_COMMUNITY): Payer: Self-pay | Admitting: Obstetrics & Gynecology

## 2023-10-13 ENCOUNTER — Inpatient Hospital Stay (HOSPITAL_COMMUNITY)
Admission: AD | Admit: 2023-10-13 | Discharge: 2023-10-13 | Disposition: A | Discharge status: Home / Self Care | Payer: Self-pay | Attending: Obstetrics & Gynecology | Admitting: Obstetrics & Gynecology

## 2023-10-13 DIAGNOSIS — Z3689 Encounter for other specified antenatal screening: Secondary | ICD-10-CM | POA: Diagnosis not present

## 2023-10-13 DIAGNOSIS — M549 Dorsalgia, unspecified: Secondary | ICD-10-CM | POA: Diagnosis not present

## 2023-10-13 DIAGNOSIS — R519 Headache, unspecified: Secondary | ICD-10-CM | POA: Diagnosis not present

## 2023-10-13 DIAGNOSIS — Z3A35 35 weeks gestation of pregnancy: Secondary | ICD-10-CM

## 2023-10-13 DIAGNOSIS — O26893 Other specified pregnancy related conditions, third trimester: Secondary | ICD-10-CM

## 2023-10-13 LAB — COMPREHENSIVE METABOLIC PANEL WITH GFR
ALT: 14 U/L (ref 0–44)
AST: 20 U/L (ref 15–41)
Albumin: 2.6 g/dL — ABNORMAL LOW (ref 3.5–5.0)
Alkaline Phosphatase: 82 U/L (ref 38–126)
Anion gap: 11 (ref 5–15)
BUN: 9 mg/dL (ref 6–20)
CO2: 17 mmol/L — ABNORMAL LOW (ref 22–32)
Calcium: 8.5 mg/dL — ABNORMAL LOW (ref 8.9–10.3)
Chloride: 107 mmol/L (ref 98–111)
Creatinine, Ser: 0.57 mg/dL (ref 0.44–1.00)
GFR, Estimated: >60 mL/min (ref >60)
Glucose, Bld: 107 mg/dL — ABNORMAL HIGH (ref 70–99)
Potassium: 3.8 mmol/L (ref 3.5–5.1)
Sodium: 135 mmol/L (ref 135–145)
Total Bilirubin: 0.5 mg/dL (ref 0.0–1.2)
Total Protein: 5.8 g/dL — ABNORMAL LOW (ref 6.5–8.1)

## 2023-10-13 LAB — CBC
HCT: 30.7 % — ABNORMAL LOW (ref 36.0–46.0)
Hemoglobin: 9.9 g/dL — ABNORMAL LOW (ref 12.0–15.0)
MCH: 25.5 pg — ABNORMAL LOW (ref 26.0–34.0)
MCHC: 32.2 g/dL (ref 30.0–36.0)
MCV: 79.1 fL — ABNORMAL LOW (ref 80.0–100.0)
Platelets: 278 K/uL (ref 150–400)
RBC: 3.88 MIL/uL (ref 3.87–5.11)
RDW: 14.4 % (ref 11.5–15.5)
WBC: 10.9 K/uL — ABNORMAL HIGH (ref 4.0–10.5)
nRBC: 0.2 % (ref 0.0–0.2)

## 2023-10-13 LAB — PROTEIN / CREATININE RATIO, URINE
Creatinine, Urine: 88 mg/dL
Protein Creatinine Ratio: 0.26 mg/mg{creat} — ABNORMAL HIGH (ref 0.00–0.15)
Total Protein, Urine: 23 mg/dL

## 2023-10-13 MED ORDER — LACTATED RINGERS IV BOLUS
1000.0000 mL | Freq: Once | INTRAVENOUS | Status: AC
Start: 2023-10-13 — End: 2023-10-13
  Administered 2023-10-13: 1000 mL via INTRAVENOUS

## 2023-10-13 MED ORDER — METOCLOPRAMIDE HCL 10 MG PO TABS: 10.0000 mg | ORAL_TABLET | Freq: Four times a day (QID) | ORAL | 0 refills | Status: DC

## 2023-10-13 MED ORDER — METOCLOPRAMIDE HCL 5 MG/ML IJ SOLN
10.0000 mg | Freq: Once | INTRAMUSCULAR | Status: AC
  Administered 2023-10-13: 10 mg via INTRAVENOUS
  Filled 2023-10-13: qty 2

## 2023-10-13 MED ORDER — ACETAMINOPHEN-CAFFEINE 500-65 MG PO TABS
2.0000 | ORAL_TABLET | Freq: Once | ORAL | Status: AC
  Administered 2023-10-13: 2 via ORAL
  Filled 2023-10-13: qty 2

## 2023-10-13 MED ORDER — DIPHENHYDRAMINE HCL 50 MG/ML IJ SOLN
25.0000 mg | INTRAMUSCULAR | Status: DC | PRN
Start: 2023-10-13 — End: 2023-10-13
  Filled 2023-10-13: qty 1

## 2023-10-13 MED ORDER — ACETAMINOPHEN-CAFFEINE 500-65 MG PO TABS
2.0000 | ORAL_TABLET | Freq: Four times a day (QID) | ORAL | 0 refills | Status: DC | PRN
Start: 2023-10-13 — End: 2023-11-24

## 2023-10-13 NOTE — Discharge Instructions (Addendum)
 You were seen for a headache, changes in your visions and pain in the upper part of your abdomen. Your labs were normal and it is does not look like you have preeclampsia currently.  We treated you with excedrin tension to help the headache, reglan  and fluids. You headache was gone.

## 2023-10-13 NOTE — MAU Provider Note (Signed)
 History     CSN: [id number]  Arrival date and time: 10/13/23 1353   Event Date/Time   First Provider Initiated Contact with Patient 10/13/23 1519      Chief Complaint  Patient presents with   Abdominal Pain   Headache   Back Pain   HPI Patient is 33 y.o. [id number] [redacted]w[redacted]d here with complaints of headache, changes in vision and upper abdominal pain. She reports a HA started 2 days prior. Took tylenol  1000mg  BID on Sunday and 1000mg  once on Monday. HA improved but did not resolve. She reports associated intermittent seeing black spots. She reports also pain across the upper part of her belly. She reports having history of anemia, constipation and yeast infections in this pregnancy. She is also complaining of back pain, this has been present for > 1 week. Reports yeast infection x 3 this pregnancy-- used clotimazole and not resolving.   +FM, denies LOF, VB, contractions, vaginal discharge.   Prenatal provider: [facility]  OB History     Gravida  3   Para  2   Term  2   Preterm      AB      Living  2      SAB      IAB      Ectopic      Multiple  0   Live Births  2           Past Medical History:  Diagnosis Date   Anemia    Hypertension of pregnancy, transient    Hypertension of pregnancy, transient    Normal labor 06/27/2016   Positive GBS test 12/02/2018    Past Surgical History:  Procedure Laterality Date   NO PAST SURGERIES      Family History  Problem Relation Age of Onset   CAD Neg Hx     Social History   Tobacco Use   Smoking status: Never   Smokeless tobacco: Never  Vaping Use   Vaping status: Never Used  Substance Use Topics   Alcohol use: No   Drug use: No    Allergies: No Known Allergies  No medications prior to admission.    Review of Systems  Constitutional:  Negative for chills and fever.  HENT:  Negative for congestion and sore throat.   Eyes:  Negative for pain and visual disturbance.  Respiratory:  Negative for cough,  chest tightness and shortness of breath.   Cardiovascular:  Negative for chest pain.  Gastrointestinal:  Negative for abdominal pain, diarrhea, nausea and vomiting.  Endocrine: Negative for cold intolerance and heat intolerance.  Genitourinary:  Negative for dysuria and flank pain.  Musculoskeletal:  Negative for back pain.  Skin:  Negative for rash.  Allergic/Immunologic: Negative for food allergies.  Neurological:  Negative for dizziness and light-headedness.  Psychiatric/Behavioral:  Negative for agitation.    Physical Exam   Blood pressure 115/64, pulse 85, temperature 98.4 F (36.9 C), temperature source Oral, resp. rate 18, height 5' 6 (1.676 m), weight 101.3 kg, last menstrual period 02/10/2023, SpO2 100%, unknown if currently breastfeeding.  Patient Vitals for the past 24 hrs:  BP Temp Temp src Pulse Resp SpO2 Height Weight  10/13/23 1810 115/64 -- -- 85 -- -- -- --  10/13/23 1634 -- -- -- -- -- 100 % -- --  10/13/23 1630 109/72 -- -- (!) 102 -- -- -- --  10/13/23 1629 -- -- -- -- -- 98 % -- --  10/13/23 1624 -- -- -- -- --  99 % -- --  10/13/23 1619 -- -- -- -- -- 99 % -- --  10/13/23 1615 111/70 -- -- (!) 101 -- -- -- --  10/13/23 1614 -- -- -- -- -- 98 % -- --  10/13/23 1609 -- -- -- -- -- 99 % -- --  10/13/23 1604 -- -- -- -- -- 99 % -- --  10/13/23 1600 114/74 -- -- (!) 102 -- -- -- --  10/13/23 1559 -- -- -- -- -- 99 % -- --  10/13/23 1554 -- -- -- -- -- 100 % -- --  10/13/23 1550 -- -- -- -- -- 100 % -- --  10/13/23 1545 116/78 -- -- (!) 119 -- 100 % -- --  10/13/23 1539 -- -- -- -- -- 100 % -- --  10/13/23 1535 -- -- -- -- -- 100 % -- --  10/13/23 1530 122/75 -- -- 99 -- 100 % -- --  10/13/23 1525 -- -- -- -- -- 100 % -- --  10/13/23 1520 -- -- -- -- -- 100 % -- --  10/13/23 1515 114/74 -- -- (!) 104 -- 99 % -- --  10/13/23 1510 -- -- -- -- -- 99 % -- --  10/13/23 1504 -- -- -- -- -- 99 % -- --  10/13/23 1500 123/77 -- -- (!) 109 -- -- -- --  10/13/23 1459 --  -- -- -- -- 100 % -- --  10/13/23 1436 120/74 98.4 F (36.9 C) Oral (!) 106 18 99 % 5' 6 (1.676 m) 101.3 kg    Physical Exam Vitals and nursing note reviewed.  Constitutional:      General: She is not in acute distress.    Appearance: She is well-developed.     Comments: Pregnant female  HENT:     Head: Normocephalic and atraumatic.  Eyes:     General: No scleral icterus.    Conjunctiva/sclera: Conjunctivae normal.  Cardiovascular:     Rate and Rhythm: Normal rate.  Pulmonary:     Effort: Pulmonary effort is normal.  Chest:     Chest wall: No tenderness.  Abdominal:     Palpations: Abdomen is soft.     Tenderness: There is no abdominal tenderness. There is no guarding or rebound.     Comments: Gravid  Genitourinary:    Vagina: Normal.  Musculoskeletal:        General: Normal range of motion.     Cervical back: Normal range of motion and neck supple.  Skin:    General: Skin is warm and dry.     Findings: No rash.  Neurological:     Mental Status: She is alert and oriented to person, place, and time.     MAU Course  Procedures  MDM- high - Atypical PEC r/o given HA, changes in vision - CMP, CBC, Urine protein creatinine - BP was normal range the entire time - Excedrin tension   Orders Placed This Encounter  Procedures   CBC   Comprehensive metabolic panel with GFR   Protein / creatinine ratio, urine   Insert peripheral IV   Discharge patient Discharge disposition: 01-Home or Self Care; Discharge patient date: 10/13/2023   Meds ordered this encounter  Medications   acetaminophen -caffeine  (EXCEDRIN TENSION HEADACHE) 500-65 MG per tablet 2 tablet   metoCLOPramide  (REGLAN ) injection 10 mg   lactated ringers  bolus 1,000 mL   diphenhydrAMINE  (BENADRYL ) injection 25 mg   metoCLOPramide  (REGLAN ) 10 MG tablet    Sig: Take 1  tablet (10 mg total) by mouth every 6 (six) hours.    Dispense:  30 tablet    Refill:  0    Supervising Provider:   PRATT, TANYA S [[id number]]    acetaminophen -caffeine  (EXCEDRIN TENSION HEADACHE) 500-65 MG TABS per tablet    Sig: Take 2 tablets by mouth 4 (four) times daily as needed (Foe headaches).    Dispense:  60 tablet    Refill:  0    Supervising Provider:   PRATT, TANYA S [[id number]]   [phone] Reassessed patient- HA improved to 4/10 but still present. Ordered IV bolus, reglan .  Seen by NP and improved and sent home  Assessment and Plan   1. Pregnancy headache in third trimester   2. NST (non-stress test) reactive   3. [redacted] weeks gestation of pregnancy     - Discharged stable condition - HA completely resolved on recheck - Reviewed return precautions - Home with reglan  and excedrin - Follow up next week at [facility] 10/13/2023, 6:34 PM   Allergies as of 10/13/2023   No Known Allergies      Medication List     TAKE these medications    acetaminophen  325 MG tablet Commonly known as: Tylenol  Take 2 tablets (650 mg total) by mouth every 4 (four) hours as needed (for pain scale < 4).   acetaminophen -caffeine  500-65 MG Tabs per tablet Commonly known as: EXCEDRIN TENSION HEADACHE Take 2 tablets by mouth 4 (four) times daily as needed (Foe headaches).   methocarbamol  500 MG tablet Commonly known as: ROBAXIN  Take 1-2 tablets (500-1,000 mg total) by mouth every 8 (eight) hours as needed (back pain).   metoCLOPramide  10 MG tablet Commonly known as: REGLAN  Take 1 tablet (10 mg total) by mouth every 6 (six) hours.   prenatal multivitamin Tabs tablet Take 1 tablet by mouth daily at 12 noon.   VITAMIN D-3 PO Take by mouth.

## 2023-10-13 NOTE — MAU Note (Signed)
 April Fox is a 33 y.o. at [redacted]w[redacted]d here in MAU reporting: HA x1 wk.  Has been dizzy for 2 days. No energy.  Pain in upper abd, all the way across.  Having blurring and floaters- started yesterday. Took Tylenol  for HA- did not help-last was yesterday. Having lower back and hip pain.  Hands are swelling and hurts, can't fold them or close them.  Denies bleeding, leaking.  Dx with another yeast infection, finished cream- itching and discomfort continues Onset of complaint: wk ago Pain score: HA 6, abd- not currently hurting, lower back 6 There were no vitals filed for this visit.   FHT:168 Lab orders placed from triage:  blood work drawn in triage. Urine collected

## 2023-10-20 LAB — OB RESULTS CONSOLE GBS: GBS: NEGATIVE

## 2023-10-29 ENCOUNTER — Ambulatory Visit: Attending: Obstetrics and Gynecology | Admitting: Maternal & Fetal Medicine

## 2023-10-29 ENCOUNTER — Ambulatory Visit

## 2023-10-29 VITALS — BP 120/78 | HR 100

## 2023-10-29 DIAGNOSIS — Z3A37 37 weeks gestation of pregnancy: Secondary | ICD-10-CM | POA: Diagnosis not present

## 2023-10-29 DIAGNOSIS — O26843 Uterine size-date discrepancy, third trimester: Secondary | ICD-10-CM | POA: Diagnosis present

## 2023-10-29 NOTE — Progress Notes (Signed)
   Patient information  Patient Name: April Fox  Patient MRN:   [id number]  Referring practice: MFM Referring Provider: [facility])  Problem List   Patient Active Problem List   Diagnosis Date Noted   Uterine size-date discrepancy in third trimester 10/02/2023   Supervision of normal intrauterine pregnancy in multigravida in third trimester 10/02/2023   [redacted] weeks gestation of pregnancy 10/02/2023   Maternal Fetal medicine Consult  April Fox is a 33 y.o. G3P2002 at [redacted]w[redacted]d here for ultrasound and consultation. April Fox is doing well today with no acute concerns. Today we focused on the following:   The patient was sent back for size > dates. The US  shows normal fetal biometry and fluid by the AFI. There is one pocket of 9 cm but this is more likely to overestimate true poly.  I discussed the estimated fetal weight is at the upper limit of normal.  The patient reports that she does not eat excessive carbohydrates.  At this point there is no indication for future ultrasounds or antenatal testing at this time.  The patient had time to ask questions that were answered to her satisfaction.  She verbalized understanding and agrees to proceed with the plan below.  Sonographic findings Single intrauterine pregnancy at 37w 2d.  Fetal cardiac activity:  Observed and appears normal. Presentation: Cephalic. Interval fetal anatomy appears normal. Fetal biometry shows the estimated fetal weight at the 87 percentile. Amniotic fluid volume: Within normal limits. MVP: 9.91 cm. Placenta: Anterior.  There are limitations of prenatal ultrasound such as the inability to detect certain abnormalities due to poor visualization. Various factors such as fetal position, gestational age and maternal body habitus may increase the difficulty in visualizing the fetal anatomy.    Recommendations -No further ultrasounds are recommended at this time based on the current  indications. If future indications arise (e.g. size/date discrepancy on fundal height, gestational diabetes or hypertension) and an ultrasound is to be desired at our MFM office, please send a referral.   Review of Systems: A review of systems was performed and was negative except per HPI   Vitals and Physical Exam    10/29/2023    1:20 PM 10/13/2023    6:10 PM 10/13/2023    4:30 PM  Vitals with BMI  Systolic 120 115 890  Diastolic 78 64 72  Pulse 100 85 102    Sitting comfortably on the sonogram table Nonlabored breathing Normal rate and rhythm Abdomen is nontender  Past pregnancies OB History  Gravida Para Term Preterm AB Living  3 2 2   2   SAB IAB Ectopic Multiple Live Births     0 2    # Outcome Date GA Lbr Len/2nd Weight Sex Type Anes PTL Lv  3 Current           2 Term 12/02/18 [redacted]w[redacted]d 11:00 / 00:41 7 lb 9.2 oz (3.435 kg) M Vag-Spont EPI  LIV  1 Term 06/27/16 [redacted]w[redacted]d 09:19 / 00:29 7 lb 1.6 oz (3.22 kg) F Vag-Spont EPI  LIV    I spent 20 minutes reviewing the patients chart, including labs and images as well as counseling the patient about her medical conditions. Greater than 50% of the time was spent in direct face-to-face patient counseling.  Delora Smaller  MFM, [place]   10/29/2023  2:02 PM

## 2023-11-18 ENCOUNTER — Telehealth (HOSPITAL_COMMUNITY): Payer: Self-pay | Admitting: *Deleted

## 2023-11-18 ENCOUNTER — Encounter (HOSPITAL_COMMUNITY): Payer: Self-pay | Admitting: *Deleted

## 2023-11-18 NOTE — Telephone Encounter (Signed)
 Preadmission screen

## 2023-11-20 ENCOUNTER — Other Ambulatory Visit (INDEPENDENT_AMBULATORY_CARE_PROVIDER_SITE_OTHER): Payer: Self-pay

## 2023-11-20 ENCOUNTER — Ambulatory Visit (INDEPENDENT_AMBULATORY_CARE_PROVIDER_SITE_OTHER): Admitting: *Deleted

## 2023-11-20 VITALS — BP 132/82 | HR 79

## 2023-11-20 DIAGNOSIS — Z3483 Encounter for supervision of other normal pregnancy, third trimester: Secondary | ICD-10-CM

## 2023-11-20 DIAGNOSIS — Z3A40 40 weeks gestation of pregnancy: Secondary | ICD-10-CM

## 2023-11-20 DIAGNOSIS — O480 Post-term pregnancy: Secondary | ICD-10-CM

## 2023-11-20 NOTE — Progress Notes (Signed)

## 2023-11-23 ENCOUNTER — Inpatient Hospital Stay (HOSPITAL_COMMUNITY): Admitting: Anesthesiology

## 2023-11-23 ENCOUNTER — Other Ambulatory Visit: Payer: Self-pay

## 2023-11-23 ENCOUNTER — Encounter (HOSPITAL_COMMUNITY): Payer: Self-pay | Admitting: Obstetrics and Gynecology

## 2023-11-23 ENCOUNTER — Inpatient Hospital Stay (HOSPITAL_COMMUNITY)
Admission: AD | Admit: 2023-11-23 | Discharge: 2023-11-24 | DRG: 807 | Disposition: A | Discharge status: Home / Self Care | Attending: Family Medicine | Admitting: Family Medicine

## 2023-11-23 DIAGNOSIS — O9902 Anemia complicating childbirth: Secondary | ICD-10-CM | POA: Diagnosis present

## 2023-11-23 DIAGNOSIS — Z3A40 40 weeks gestation of pregnancy: Secondary | ICD-10-CM

## 2023-11-23 DIAGNOSIS — O99824 Streptococcus B carrier state complicating childbirth: Secondary | ICD-10-CM | POA: Diagnosis present

## 2023-11-23 DIAGNOSIS — O480 Post-term pregnancy: Principal | ICD-10-CM | POA: Diagnosis present

## 2023-11-23 DIAGNOSIS — Z3483 Encounter for supervision of other normal pregnancy, third trimester: Secondary | ICD-10-CM

## 2023-11-23 DIAGNOSIS — O26893 Other specified pregnancy related conditions, third trimester: Secondary | ICD-10-CM | POA: Diagnosis present

## 2023-11-23 LAB — CBC
HCT: 31.3 % — ABNORMAL LOW (ref 36.0–46.0)
Hemoglobin: 9.7 g/dL — ABNORMAL LOW (ref 12.0–15.0)
MCH: 23.1 pg — ABNORMAL LOW (ref 26.0–34.0)
MCHC: 31 g/dL (ref 30.0–36.0)
MCV: 74.5 fL — ABNORMAL LOW (ref 80.0–100.0)
Platelets: 326 K/uL (ref 150–400)
RBC: 4.2 MIL/uL (ref 3.87–5.11)
RDW: 16.9 % — ABNORMAL HIGH (ref 11.5–15.5)
WBC: 10.7 K/uL — ABNORMAL HIGH (ref 4.0–10.5)
nRBC: 0.4 % — ABNORMAL HIGH (ref 0.0–0.2)

## 2023-11-23 LAB — TYPE AND SCREEN
ABO/RH(D): A POS
Antibody Screen: NEGATIVE

## 2023-11-23 LAB — RPR: RPR Ser Ql: NONREACTIVE

## 2023-11-23 MED ORDER — LACTATED RINGERS IV SOLN: 500.0000 mL | Freq: Once | INTRAVENOUS | Status: DC

## 2023-11-23 MED ORDER — PHENYLEPHRINE 80 MCG/ML (10ML) SYRINGE FOR IV PUSH (FOR BLOOD PRESSURE SUPPORT): 80.0000 ug | PREFILLED_SYRINGE | INTRAVENOUS | Status: DC | PRN

## 2023-11-23 MED ORDER — DIPHENHYDRAMINE HCL 25 MG PO CAPS: 25.0000 mg | ORAL_CAPSULE | Freq: Four times a day (QID) | ORAL | Status: DC | PRN

## 2023-11-23 MED ORDER — OXYTOCIN-SODIUM CHLORIDE 30-0.9 UT/500ML-% IV SOLN
2.5000 [IU]/h | INTRAVENOUS | Status: DC
  Filled 2023-11-23: qty 500

## 2023-11-23 MED ORDER — ACETAMINOPHEN 325 MG PO TABS: 650.0000 mg | ORAL_TABLET | ORAL | Status: DC | PRN

## 2023-11-23 MED ORDER — FENTANYL-BUPIVACAINE-NACL 0.5-0.125-0.9 MG/250ML-% EP SOLN
12.0000 mL/h | EPIDURAL | Status: DC | PRN
  Administered 2023-11-23: 12 mL/h via EPIDURAL

## 2023-11-23 MED ORDER — LIDOCAINE HCL (PF) 1 % IJ SOLN
INTRAMUSCULAR | Status: DC | PRN
  Administered 2023-11-23: 5 mL via EPIDURAL
  Administered 2023-11-23: 2 mL via EPIDURAL
  Administered 2023-11-23: 3 mL via EPIDURAL

## 2023-11-23 MED ORDER — EPHEDRINE 5 MG/ML INJ: 10.0000 mg | INTRAVENOUS | Status: DC | PRN

## 2023-11-23 MED ORDER — FENTANYL CITRATE (PF) 100 MCG/2ML IJ SOLN: 50.0000 ug | INTRAMUSCULAR | Status: DC | PRN

## 2023-11-23 MED ORDER — IBUPROFEN 600 MG PO TABS
600.0000 mg | ORAL_TABLET | Freq: Four times a day (QID) | ORAL | Status: DC
  Administered 2023-11-23 – 2023-11-24 (×4): 600 mg via ORAL
  Filled 2023-11-23 (×5): qty 1

## 2023-11-23 MED ORDER — OXYCODONE-ACETAMINOPHEN 5-325 MG PO TABS: 1.0000 | ORAL_TABLET | ORAL | Status: DC | PRN

## 2023-11-23 MED ORDER — COCONUT OIL OIL: 1.0000 | TOPICAL_OIL | Status: DC | PRN

## 2023-11-23 MED ORDER — OXYCODONE-ACETAMINOPHEN 5-325 MG PO TABS: 2.0000 | ORAL_TABLET | ORAL | Status: DC | PRN

## 2023-11-23 MED ORDER — ONDANSETRON HCL 4 MG PO TABS: 4.0000 mg | ORAL_TABLET | ORAL | Status: DC | PRN

## 2023-11-23 MED ORDER — ACETAMINOPHEN 325 MG PO TABS
650.0000 mg | ORAL_TABLET | ORAL | Status: DC | PRN
  Administered 2023-11-23: 650 mg via ORAL
  Filled 2023-11-23: qty 2

## 2023-11-23 MED ORDER — OXYTOCIN BOLUS FROM INFUSION: 333.0000 mL | Freq: Once | INTRAVENOUS | Status: DC

## 2023-11-23 MED ORDER — FENTANYL-BUPIVACAINE-NACL 0.5-0.125-0.9 MG/250ML-% EP SOLN
EPIDURAL | Status: AC
  Filled 2023-11-23: qty 250

## 2023-11-23 MED ORDER — WITCH HAZEL-GLYCERIN EX PADS: 1.0000 | MEDICATED_PAD | CUTANEOUS | Status: DC | PRN

## 2023-11-23 MED ORDER — ONDANSETRON HCL 4 MG/2ML IJ SOLN: 4.0000 mg | INTRAMUSCULAR | Status: DC | PRN

## 2023-11-23 MED ORDER — OXYCODONE HCL 5 MG PO TABS: 10.0000 mg | ORAL_TABLET | ORAL | Status: DC | PRN

## 2023-11-23 MED ORDER — SENNOSIDES-DOCUSATE SODIUM 8.6-50 MG PO TABS
2.0000 | ORAL_TABLET | Freq: Every day | ORAL | Status: DC
  Administered 2023-11-24: 2 via ORAL
  Filled 2023-11-23: qty 2

## 2023-11-23 MED ORDER — DIBUCAINE (PERIANAL) 1 % EX OINT: 1.0000 | TOPICAL_OINTMENT | CUTANEOUS | Status: DC | PRN

## 2023-11-23 MED ORDER — LACTATED RINGERS IV SOLN: INTRAVENOUS | Status: DC

## 2023-11-23 MED ORDER — OXYCODONE HCL 5 MG PO TABS: 5.0000 mg | ORAL_TABLET | ORAL | Status: DC | PRN

## 2023-11-23 MED ORDER — LIDOCAINE HCL (PF) 1 % IJ SOLN: 30.0000 mL | INTRAMUSCULAR | Status: DC | PRN

## 2023-11-23 MED ORDER — DIPHENHYDRAMINE HCL 50 MG/ML IJ SOLN: 12.5000 mg | INTRAMUSCULAR | Status: DC | PRN

## 2023-11-23 MED ORDER — LACTATED RINGERS IV SOLN: 500.0000 mL | INTRAVENOUS | Status: DC | PRN

## 2023-11-23 MED ORDER — TETANUS-DIPHTH-ACELL PERTUSSIS 5-2-15.5 LF-MCG/0.5 IM SUSP: 0.5000 mL | Freq: Once | INTRAMUSCULAR | Status: DC

## 2023-11-23 MED ORDER — PRENATAL MULTIVITAMIN CH
1.0000 | ORAL_TABLET | Freq: Every day | ORAL | Status: DC
  Administered 2023-11-23 – 2023-11-24 (×2): 1 via ORAL
  Filled 2023-11-23 (×2): qty 1

## 2023-11-23 MED ORDER — SIMETHICONE 80 MG PO CHEW: 80.0000 mg | CHEWABLE_TABLET | ORAL | Status: DC | PRN

## 2023-11-23 MED ORDER — ONDANSETRON HCL 4 MG/2ML IJ SOLN: 4.0000 mg | Freq: Four times a day (QID) | INTRAMUSCULAR | Status: DC | PRN

## 2023-11-23 MED ORDER — SOD CITRATE-CITRIC ACID 500-334 MG/5ML PO SOLN: 30.0000 mL | ORAL | Status: DC | PRN

## 2023-11-23 MED ORDER — BENZOCAINE-MENTHOL 20-0.5 % EX AERO
1.0000 | INHALATION_SPRAY | CUTANEOUS | Status: DC | PRN
  Administered 2023-11-24: 1 via TOPICAL
  Filled 2023-11-23: qty 56

## 2023-11-23 NOTE — Lactation Note (Signed)
 This note was copied from a baby's chart. Lactation Consultation Note  Patient Name: Boy Ynez Eugenio Unijb'd Date: 11/23/2023 Age:34 hours Reason for consult: Initial assessment;Term  P3- MOB reports that infant has been feeding well so far and she has no concerns. Infant was not present in the room. Per MOB infant was placed in the nursery to help with his temperature. MOB asks to not been seen by the [facility] team unless she calls because this is her third baby. LC sent a [facility] referral for a pump per MOB request. LC reviewed the first 24 hr birthday nap, day 2 cluster feeding, feeding infant on cue 8-12x in 24 hrs, not allowing infant to go over 3 hrs without a feeding, CDC milk storage guidelines, LC services handout and engorgement/breast care. LC encouraged MOB to call for further assistance as needed.  Maternal Data Has patient been taught Hand Expression?: No Does the patient have breastfeeding experience prior to this delivery?: Yes How long did the patient breastfeed?: a long time per MOB  Feeding Mother's Current Feeding Choice: Breast Milk Nipple Type: Slow - flow  Lactation Tools Discussed/Used Pump Education: Milk Storage  Interventions Interventions: Breast feeding basics reviewed;Education;LC Services brochure  Discharge Discharge Education: Engorgement and breast care;Warning signs for feeding baby Pump: DEBP;Personal;Referral sent for [facility] Pump  Consult Status Consult Status: Complete (mother declined follow up)    Recardo Hoit BS, IBCLC 11/23/2023, 11:25 AM

## 2023-11-23 NOTE — H&P (Cosign Needed Addendum)
 OBSTETRIC ADMISSION HISTORY AND PHYSICAL  April Fox is a 33 y.o. female G3P2002 with IUP at [redacted]w[redacted]d by 1s trimester US  presenting for SOL. She reports +FMs, No LOF, no VB, no blurry vision, headaches or peripheral edema, and RUQ pain.  She plans on breast feeding. She request is uncertain about birth control. She received her prenatal care at the [facility].    Dating: By 10w6 day US  --->  Estimated Date of Delivery: 11/17/23  Sono:    @[redacted]w[redacted]d , CWD, normal anatomy, cephalic presentation, anterior placenta, 3526g, 87% EFW   Prenatal History/Complications: none  Past Medical History: Past Medical History:  Diagnosis Date   Anemia    Hypertension of pregnancy, transient    Hypertension of pregnancy, transient    Normal labor 06/27/2016   Positive GBS test 12/02/2018    Past Surgical History: Past Surgical History:  Procedure Laterality Date   NO PAST SURGERIES      Obstetrical History: OB History     Gravida  3   Para  2   Term  2   Preterm      AB      Living  2      SAB      IAB      Ectopic      Multiple  0   Live Births  2           Social History Social History   Socioeconomic History   Marital status: Married    Spouse name: Not on file   Number of children: Not on file   Years of education: Not on file   Highest education level: Not on file  Occupational History   Not on file  Tobacco Use   Smoking status: Never   Smokeless tobacco: Never  Vaping Use   Vaping status: Never Used  Substance and Sexual Activity   Alcohol use: Not Currently   Drug use: No   Sexual activity: Yes  Other Topics Concern   Not on file  Social History Narrative   Lives with Husband   Right Handed   Social Drivers of Health   Financial Resource Strain: Not on file  Food Insecurity: No Food Insecurity (11/23/2023)   Hunger Vital Sign    Worried About Running Out of Food in the Last Year: Never true    Ran Out of Food in the Last Year: Never true   Transportation Needs: No Transportation Needs (11/23/2023)   PRAPARE - Administrator, Civil Service (Medical): No    Lack of Transportation (Non-Medical): No  Physical Activity: Not on file  Stress: Not on file  Social Connections: Moderately Isolated (10/01/2023)   Social Connection and Isolation Panel    Frequency of Communication with Friends and Family: More than three times a week    Frequency of Social Gatherings with Friends and Family: Once a week    Attends Religious Services: Never    Database Administrator or Organizations: No    Attends Engineer, Structural: Never    Marital Status: Married    Family History: Family History  Problem Relation Age of Onset   CAD Neg Hx     Allergies: No Known Allergies  Medications Prior to Admission  Medication Sig Dispense Refill Last Dose/Taking   acetaminophen  (TYLENOL ) 500 MG tablet Take 500 mg by mouth every 6 (six) hours as needed.   11/23/2023 Morning   ferrous sulfate  325 (65 FE) MG tablet Take 325 mg  by mouth daily with breakfast.   11/22/2023   Prenatal Vit-Fe Fumarate-FA (PRENATAL MULTIVITAMIN) TABS tablet Take 1 tablet by mouth daily at 12 noon.   11/22/2023   acetaminophen  (TYLENOL ) 325 MG tablet Take 2 tablets (650 mg total) by mouth every 4 (four) hours as needed (for pain scale < 4). (Patient not taking: Reported on 11/23/2023) 30 tablet 0 Not Taking   acetaminophen -caffeine  (EXCEDRIN TENSION HEADACHE) 500-65 MG TABS per tablet Take 2 tablets by mouth 4 (four) times daily as needed (Foe headaches). (Patient not taking: Reported on 11/23/2023) 60 tablet 0 Not Taking   Cholecalciferol (VITAMIN D-3 PO) Take by mouth. (Patient not taking: Reported on 11/23/2023)   Not Taking   methocarbamol  (ROBAXIN ) 500 MG tablet Take 1-2 tablets (500-1,000 mg total) by mouth every 8 (eight) hours as needed (back pain). (Patient not taking: Reported on 11/23/2023) 60 tablet 1 Not Taking   metoCLOPramide  (REGLAN ) 10 MG  tablet Take 1 tablet (10 mg total) by mouth every 6 (six) hours. (Patient not taking: Reported on 11/23/2023) 30 tablet 0 Not Taking     Review of Systems   All systems reviewed and negative except as stated in HPI  Blood pressure 137/89, pulse 93, resp. rate 18, last menstrual period 02/10/2023, SpO2 95%, unknown if currently breastfeeding. General appearance: alert, cooperative, and appears stated age Lungs: clear to auscultation bilaterally Heart: regular rate and rhythm Abdomen: soft, non-tender; bowel sounds normal Pelvic: 7/90/-2 Presentation: cephalic Fetal monitoringBaseline: 140 bpm, Variability: Good {> 6 bpm), Accelerations: Reactive, and Decelerations: Absent Uterine activityDate/time of onset: 2300 10/26 and Frequency: Every 2 minutes     Prenatal labs: ABO, Rh:   Antibody: Negative (03/31 0000) Rubella: Immune (03/31 0000) RPR: Nonreactive (03/31 0000)  HBsAg: Negative (03/31 0000)  HIV: Non-reactive (03/31 0000)  GBS: Negative/-- (09/23 0000)    Lab Results  Component Value Date   GBS Negative 10/20/2023   GTT negative Genetic screening  normal Anatomy US  normal  There is no immunization history for the selected administration types on file for this patient.  Prenatal Transfer Tool  Maternal Diabetes: No Genetic Screening: Normal Maternal Ultrasounds/Referrals: Normal Fetal Ultrasounds or other Referrals:  None Maternal Substance Abuse:  No Significant Maternal Medications:  None Significant Maternal Lab Results: Group B Strep negative Number of Prenatal Visits:greater than 3 verified prenatal visits Maternal Vaccinations: TDAP Other Comments:  None   No results found for this or any previous visit (from the past 24 hours).  Patient Active Problem List   Diagnosis Date Noted   Uterine size-date discrepancy in third trimester 10/02/2023   Supervision of normal intrauterine pregnancy in multigravida in third trimester 10/02/2023   [redacted] weeks  gestation of pregnancy 10/02/2023    Assessment/Plan:  April Fox is a 33 y.o. G3P2002 at [redacted]w[redacted]d here for SOL.   #Labor:Contractions started 3 hours ago at 2300, noted to be dilate to 6cm in the MAU. Membranes currently intact and bulging.  #Pain: Epidural #FWB: CAT I #GBS status:  negative #Feeding: Breastmilk  #Reproductive Life planning: Not Discussed #Circ:  yes  [name], MD  11/23/2023, 1:24 AM  GME ATTESTATION:  Evaluation and management procedures were performed by the [facility] Medicine Resident under my supervision. I was immediately available for direct supervision, assistance and direction throughout this encounter.  I also confirm that I have verified the information documented in the resident's note, and that I have also personally reperformed the pertinent components of the physical exam and all of the  medical decision making activities.  I have also made any necessary editorial changes.  Leeroy KATHEE Pouch, MD OB Fellow, Faculty Practice [facility], [facility] 11/23/2023 9:09 AM

## 2023-11-23 NOTE — MAU Note (Signed)
 April Fox is a 33 y.o. at [redacted]w[redacted]d here in MAU reporting: contractions starting around 11pm; states they are very close.  LMP: 02/10/2023 Onset of complaint: 11pm on 11/22/2023 Pain score: 6-7/10 with contractions There were no vitals filed for this visit.   FHT: 150bpm Lab orders placed from triage: RN Labor Evaluation

## 2023-11-23 NOTE — Discharge Summary (Shared)
 Postpartum Discharge Summary  Date of Service updated***     Patient Name: April Fox DOB: Jul 22, 1990 MRN: [id number]  Date of admission: 11/23/2023 Delivery date:11/23/2023 Delivering provider: TRUDY CZAR B Date of discharge: 11/23/2023  Admitting diagnosis: Normal labor [O80, Z37.9] Intrauterine pregnancy: [redacted]w[redacted]d     Secondary diagnosis:  Active Problems:   Normal labor  Additional problems: None    Discharge diagnosis: {DX.:23714}                                              Post partum procedures:{Postpartum procedures:23558} Augmentation: AROM Complications: None, Shoulder Dystocia  Hospital course: Onset of Labor With Vaginal Delivery       33 y.o. yo G3P2002 at [redacted]w[redacted]d was admitted in Active Labor on 11/23/2023. Labor course was complicated by shoulder dystocia.    Membrane Rupture Time/Date: 2:28 AM,11/23/2023  Delivery Method:Vaginal, Spontaneous Operative Delivery:N/A Episiotomy: None Lacerations:  1st degree;Periurethral Patient had a postpartum course complicated by ***.  She is ambulating, tolerating a regular diet, passing flatus, and urinating well. Patient is discharged home in stable condition on 11/23/23.  Newborn Data: Birth date:11/23/2023 Birth time:3:30 AM Gender:Female Living status:Living Apgars:9 ,9  Weight:   Magnesium Sulfate received: No BMZ received: No Rhophylac:N/A MMR:No T-DaP:Given prenatally Flu: No RSV Vaccine received: No Transfusion:No  Immunizations received: There is no immunization history for the selected administration types on file for this patient.  Physical exam  Vitals:   11/23/23 0230 11/23/23 0231 11/23/23 0237 11/23/23 0241  BP:  (!) 152/129 109/61 (!) 111/56  Pulse:  92 84 93  Resp:      TempSrc:      SpO2: 100%      General: {Exam; general:21111117} Lochia: {Desc; appropriate/inappropriate:30686::appropriate} Uterine Fundus: {Desc; firm/soft:30687} Incision: {Exam; incision:21111123} DVT  Evaluation: {Exam; dvt:2111122} Labs: Lab Results  Component Value Date   WBC 10.7 (H) 11/23/2023   HGB 9.7 (L) 11/23/2023   HCT 31.3 (L) 11/23/2023   MCV 74.5 (L) 11/23/2023   PLT 326 11/23/2023      Latest Ref Rng & Units 10/13/2023    2:31 PM  CMP  Glucose 70 - 99 mg/dL 892   BUN 6 - 20 mg/dL 9   Creatinine 9.55 - 8.99 mg/dL 9.42   Sodium 864 - 854 mmol/L 135   Potassium 3.5 - 5.1 mmol/L 3.8   Chloride 98 - 111 mmol/L 107   CO2 22 - 32 mmol/L 17   Calcium 8.9 - 10.3 mg/dL 8.5   Total Protein 6.5 - 8.1 g/dL 5.8   Total Bilirubin 0.0 - 1.2 mg/dL 0.5   Alkaline Phos 38 - 126 U/L 82   AST 15 - 41 U/L 20   ALT 0 - 44 U/L 14    Edinburgh Score:     No data to display         No data recorded  After visit meds:  Allergies as of 11/23/2023   No Known Allergies   Med Rec must be completed prior to using this [facility]***        Discharge home in stable condition Infant Feeding: Breast Infant Disposition:home with mother Discharge instruction: per After Visit Summary and Postpartum booklet. Activity: Advance as tolerated. Pelvic rest for 6 weeks.  Diet: routine diet Future Appointments: Future Appointments  Date Time Provider Department Center  11/25/2023  6:45 AM [facility] None   Follow up Visit:   Please schedule this patient for a In person postpartum visit in 6 weeks with the following provider: Dr. True.  Low risk pregnancy that was uncomplicated.  Delivery mode:  Vaginal, Spontaneous Anticipated Birth Control:  Unsure   11/23/2023 April FORBES Aline, MD

## 2023-11-23 NOTE — Anesthesia Procedure Notes (Signed)
 Epidural Patient location during procedure: OB Start time: 11/23/2023 1:55 AM End time: 11/23/2023 2:03 AM  Staffing Anesthesiologist: Epifanio Charleston, MD Performed: anesthesiologist   Preanesthetic Checklist Completed: patient identified, IV checked, site marked, risks and benefits discussed, surgical consent, monitors and equipment checked, pre-op evaluation and timeout performed  Epidural Patient position: sitting Prep: DuraPrep and site prepped and draped Patient monitoring: continuous pulse ox and blood pressure Approach: midline Injection technique: LOR air  Needle:  Needle insertion depth: 6.5 cm Needle type: Tuohy  Needle gauge: 17 G Needle length: 9 cm and 9 Needle insertion depth: 6.5 cm Catheter type: closed end flexible Catheter size: 19 Gauge Catheter at skin depth: 12 (11.5-->12 when sat upright) cm Test dose: negative  Assessment Events: blood not aspirated, no cerebrospinal fluid, injection not painful, no injection resistance, no paresthesia and negative IV test

## 2023-11-23 NOTE — Anesthesia Preprocedure Evaluation (Signed)
Anesthesia Evaluation  Patient identified by MRN, date of birth, ID band Patient awake    Reviewed: Allergy & Precautions, Patient's Chart, lab work & pertinent test results  Airway Mallampati: II  TM Distance: >3 FB     Dental   Pulmonary neg pulmonary ROS   Pulmonary exam normal        Cardiovascular negative cardio ROS Normal cardiovascular exam     Neuro/Psych negative neurological ROS     GI/Hepatic negative GI ROS, Neg liver ROS,,,  Endo/Other  negative endocrine ROS    Renal/GU negative Renal ROS     Musculoskeletal   Abdominal   Peds  Hematology  (+) Blood dyscrasia, anemia   Anesthesia Other Findings   Reproductive/Obstetrics (+) Pregnancy                             Anesthesia Physical Anesthesia Plan  ASA: 2  Anesthesia Plan: Epidural   Post-op Pain Management:    Induction:   PONV Risk Score and Plan: 2 and Treatment may vary due to age or medical condition  Airway Management Planned: Natural Airway  Additional Equipment:   Intra-op Plan:   Post-operative Plan:   Informed Consent: I have reviewed the patients History and Physical, chart, labs and discussed the procedure including the risks, benefits and alternatives for the proposed anesthesia with the patient or authorized representative who has indicated his/her understanding and acceptance.       Plan Discussed with:   Anesthesia Plan Comments:        Anesthesia Quick Evaluation

## 2023-11-23 NOTE — Anesthesia Postprocedure Evaluation (Signed)
 Anesthesia Post Note  Patient: Makaila Savas  Procedure(s) Performed: AN AD HOC LABOR EPIDURAL     Patient location during evaluation: Mother Baby Anesthesia Type: Epidural Level of consciousness: awake and alert Pain management: pain level controlled Vital Signs Assessment: post-procedure vital signs reviewed and stable Respiratory status: spontaneous breathing, nonlabored ventilation and respiratory function stable Cardiovascular status: stable Postop Assessment: no headache, no backache, epidural receding, no apparent nausea or vomiting, patient able to bend at knees, able to ambulate and adequate PO intake Anesthetic complications: no   No notable events documented.  Last Vitals:  Vitals:   11/23/23 1035 11/23/23 1639  BP: 127/81 121/70  Pulse: 78 74  Resp: 16 16  Temp: 36.7 C 36.8 C  SpO2: 100% 100%    Last Pain:  Vitals:   11/23/23 1639  TempSrc: Oral  PainSc:    Pain Goal:                   Haislee Corso Hristova

## 2023-11-23 NOTE — Lactation Note (Signed)
 This note was copied from a baby's chart. Lactation Consultation Note  Patient Name: [name] Date: 11/23/2023 Age:34 hours   Attempted to see mom. Rm. Dark. Mom sleeping, dad looked up at [facility]. LC waved.   Maternal Data    Feeding    LATCH Score                    Lactation Tools Discussed/Used    Interventions    Discharge    Consult Status      April Fox 11/23/2023, 6:25 AM

## 2023-11-24 ENCOUNTER — Other Ambulatory Visit (HOSPITAL_COMMUNITY): Payer: Self-pay

## 2023-11-24 MED ORDER — COCONUT OIL OIL: 1.0000 | TOPICAL_OIL | Status: AC | PRN

## 2023-11-24 MED ORDER — WITCH HAZEL-GLYCERIN EX PADS: 1.0000 | MEDICATED_PAD | CUTANEOUS | Status: AC | PRN

## 2023-11-24 MED ORDER — IBUPROFEN 600 MG PO TABS: 600.0000 mg | ORAL_TABLET | Freq: Four times a day (QID) | ORAL | 0 refills | Status: AC

## 2023-11-24 MED ORDER — BENZOCAINE-MENTHOL 20-0.5 % EX AERO
1.0000 | INHALATION_SPRAY | CUTANEOUS | 1 refills | Status: AC | PRN
  Filled 2023-11-24: qty 78, fill #0

## 2023-11-24 MED ORDER — DIBUCAINE (PERIANAL) 1 % EX OINT: 1.0000 | TOPICAL_OINTMENT | CUTANEOUS | Status: AC | PRN

## 2023-11-24 NOTE — Patient Instructions (Signed)

## 2023-11-25 ENCOUNTER — Inpatient Hospital Stay (HOSPITAL_COMMUNITY): Admission: RE | Admit: 2023-11-25 | Source: Home / Self Care | Admitting: Family Medicine

## 2023-11-25 ENCOUNTER — Inpatient Hospital Stay (HOSPITAL_COMMUNITY): Admission: RE | Admit: 2023-11-25 | Source: Ambulatory Visit

## 2023-12-09 ENCOUNTER — Telehealth (HOSPITAL_COMMUNITY): Payer: Self-pay | Admitting: *Deleted

## 2023-12-09 NOTE — Telephone Encounter (Signed)
 12/09/2023  Name: April Fox MRN: [id number] DOB: [birth year]  Reason for Call:  Transition of Care Hospital Discharge Call  Contact Status: Patient Contact Status: Complete  Language assistant needed: Interpreter Mode: Interpreter Not Needed        Follow-Up Questions: Do You Have Any Concerns About Your Health As You Heal From Delivery?: No Do You Have Any Concerns About Your Infants Health?: No  Edinburgh Postnatal Depression Scale:  In the Past 7 Days: I have been able to laugh and see the funny side of things.: As much as I always could I have looked forward with enjoyment to things.: As much as I ever did I have blamed myself unnecessarily when things went wrong.: No, never I have been anxious or worried for no good reason.: No, not at all I have felt scared or panicky for no good reason.: No, not at all Things have been getting on top of me.: No, I have been coping as well as ever I have been so unhappy that I have had difficulty sleeping.: Not at all I have felt sad or miserable.: No, not at all I have been so unhappy that I have been crying.: No, never The thought of harming myself has occurred to me.: Never [place] Postnatal Depression Scale Total: 0  PHQ2-9 Depression Scale:     Discharge Follow-up: Edinburgh score requires follow up?: No Patient was advised of the following resources:: Breastfeeding Support Group, Support Group  Post-discharge interventions: Reviewed Newborn Safe Sleep Practices  Steva Tammy PEAK  12/09/2023 1416
